# Patient Record
Sex: Female | Born: 1965 | Race: White | Hispanic: No | Smoking: Current every day smoker
Health system: Southern US, Community
[De-identification: ages and names within clinical notes are randomized; demographics above are authoritative.]

## PROBLEM LIST (undated history)

## (undated) HISTORY — PX: TYMPANOSTOMY TUBE PLACEMENT: SHX32

## (undated) HISTORY — PX: TONSILLECTOMY: SHX5217

---

## 2001-12-25 ENCOUNTER — Other Ambulatory Visit: Admission: RE | Admit: 2001-12-25 | Discharge: 2001-12-25 | Payer: Self-pay | Admitting: *Deleted

## 2002-02-01 ENCOUNTER — Inpatient Hospital Stay (HOSPITAL_COMMUNITY): Admission: AD | Admit: 2002-02-01 | Discharge: 2002-02-01 | Payer: Self-pay | Admitting: Gynecology

## 2002-02-02 ENCOUNTER — Ambulatory Visit (HOSPITAL_COMMUNITY): Admission: RE | Admit: 2002-02-02 | Discharge: 2002-02-02 | Payer: Self-pay | Admitting: *Deleted

## 2002-02-02 ENCOUNTER — Encounter (INDEPENDENT_AMBULATORY_CARE_PROVIDER_SITE_OTHER): Payer: Self-pay | Admitting: *Deleted

## 2004-12-31 ENCOUNTER — Other Ambulatory Visit: Admission: RE | Admit: 2004-12-31 | Discharge: 2004-12-31 | Payer: Self-pay | Admitting: Gynecology

## 2005-01-01 ENCOUNTER — Ambulatory Visit (HOSPITAL_COMMUNITY): Admission: RE | Admit: 2005-01-01 | Discharge: 2005-01-01 | Payer: Self-pay | Admitting: Gynecology

## 2006-01-03 ENCOUNTER — Ambulatory Visit (HOSPITAL_COMMUNITY): Admission: RE | Admit: 2006-01-03 | Discharge: 2006-01-03 | Payer: Self-pay | Admitting: Gynecology

## 2006-01-03 ENCOUNTER — Other Ambulatory Visit: Admission: RE | Admit: 2006-01-03 | Discharge: 2006-01-03 | Payer: Self-pay | Admitting: Gynecology

## 2006-01-18 ENCOUNTER — Encounter: Admission: RE | Admit: 2006-01-18 | Discharge: 2006-04-18 | Payer: Self-pay | Admitting: *Deleted

## 2006-04-01 ENCOUNTER — Encounter: Admission: RE | Admit: 2006-04-01 | Discharge: 2006-04-01 | Payer: Self-pay | Admitting: Orthopedic Surgery

## 2006-05-24 ENCOUNTER — Encounter: Admission: RE | Admit: 2006-05-24 | Discharge: 2006-05-24 | Payer: Self-pay | Admitting: Orthopedic Surgery

## 2006-06-08 ENCOUNTER — Encounter: Admission: RE | Admit: 2006-06-08 | Discharge: 2006-06-08 | Payer: Self-pay | Admitting: Orthopedic Surgery

## 2007-01-06 ENCOUNTER — Ambulatory Visit (HOSPITAL_COMMUNITY): Admission: RE | Admit: 2007-01-06 | Discharge: 2007-01-06 | Payer: Self-pay | Admitting: Gynecology

## 2007-01-16 ENCOUNTER — Other Ambulatory Visit: Admission: RE | Admit: 2007-01-16 | Discharge: 2007-01-16 | Payer: Self-pay | Admitting: Gynecology

## 2008-01-09 ENCOUNTER — Ambulatory Visit (HOSPITAL_COMMUNITY): Admission: RE | Admit: 2008-01-09 | Discharge: 2008-01-09 | Payer: Self-pay | Admitting: Gynecology

## 2008-01-19 ENCOUNTER — Encounter: Payer: Self-pay | Admitting: Women's Health

## 2008-01-19 ENCOUNTER — Other Ambulatory Visit: Admission: RE | Admit: 2008-01-19 | Discharge: 2008-01-19 | Payer: Self-pay | Admitting: Gynecology

## 2008-01-19 ENCOUNTER — Ambulatory Visit: Payer: Self-pay | Admitting: Women's Health

## 2008-02-14 ENCOUNTER — Ambulatory Visit: Payer: Self-pay | Admitting: Women's Health

## 2009-04-29 ENCOUNTER — Ambulatory Visit (HOSPITAL_COMMUNITY): Admission: RE | Admit: 2009-04-29 | Discharge: 2009-04-29 | Payer: Self-pay | Admitting: Gynecology

## 2009-05-19 ENCOUNTER — Other Ambulatory Visit: Admission: RE | Admit: 2009-05-19 | Discharge: 2009-05-19 | Payer: Self-pay | Admitting: Gynecology

## 2009-05-19 ENCOUNTER — Ambulatory Visit: Payer: Self-pay | Admitting: Women's Health

## 2010-04-14 ENCOUNTER — Other Ambulatory Visit: Payer: Self-pay | Admitting: Gynecology

## 2010-04-14 DIAGNOSIS — Z1231 Encounter for screening mammogram for malignant neoplasm of breast: Secondary | ICD-10-CM

## 2010-05-08 ENCOUNTER — Ambulatory Visit (HOSPITAL_COMMUNITY)
Admission: RE | Admit: 2010-05-08 | Discharge: 2010-05-08 | Disposition: A | Discharge status: Home / Self Care | Payer: BC Managed Care – PPO | Source: Ambulatory Visit | Attending: Gynecology | Admitting: Gynecology

## 2010-05-08 DIAGNOSIS — Z1231 Encounter for screening mammogram for malignant neoplasm of breast: Secondary | ICD-10-CM | POA: Insufficient documentation

## 2010-05-21 ENCOUNTER — Encounter: Payer: BC Managed Care – PPO | Admitting: Women's Health

## 2010-05-28 ENCOUNTER — Encounter (INDEPENDENT_AMBULATORY_CARE_PROVIDER_SITE_OTHER): Payer: BC Managed Care – PPO | Admitting: Women's Health

## 2010-05-28 ENCOUNTER — Other Ambulatory Visit (HOSPITAL_COMMUNITY)
Admission: RE | Admit: 2010-05-28 | Discharge: 2010-05-28 | Disposition: A | Discharge status: Home / Self Care | Payer: BC Managed Care – PPO | Source: Ambulatory Visit | Attending: Gynecology | Admitting: Gynecology

## 2010-05-28 ENCOUNTER — Other Ambulatory Visit: Payer: Self-pay | Admitting: Women's Health

## 2010-05-28 DIAGNOSIS — Z01419 Encounter for gynecological examination (general) (routine) without abnormal findings: Secondary | ICD-10-CM

## 2010-05-28 DIAGNOSIS — Z833 Family history of diabetes mellitus: Secondary | ICD-10-CM

## 2010-05-28 DIAGNOSIS — Z124 Encounter for screening for malignant neoplasm of cervix: Secondary | ICD-10-CM | POA: Insufficient documentation

## 2010-05-28 DIAGNOSIS — Z13220 Encounter for screening for lipoid disorders: Secondary | ICD-10-CM

## 2010-06-19 NOTE — Op Note (Signed)
NAME:  Angela Cooper, Angela Cooper                         ACCOUNT NO.:  [id number]   MEDICAL RECORD NO.:  [id number]                   PATIENT TYPE:  AMB   LOCATION:  [facility]                                  FACILITY:  [facility]   PHYSICIAN:  Katy Fitch, M.D.               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   HISTORY OF PRESENT ILLNESS:  The patient is a 45 year old G4, P1 at 43 weeks  who presented to the office two weeks prior for an amniocentesis secondary  to advanced maternal age.  The patient's amniocentesis was unremarkable;  however, the results came back on the 30th showing a 72 XY with a +21  chromosome.  The patient was called and notified of the results and at that  point did not want to proceed with the pregnancy.  The patient was given  options for surgical dilation and evacuation versus prostaglandin methods  and the risks for both procedures were discussed in detail with the patient.  After careful discussion with her husband, the patient has opted to proceed  with D&E which has been scheduled for the 2nd of January.   PAST MEDICAL HISTORY:  Significant for depression and asthma.   PAST SURGICAL HISTORY:  Two elective D&Cs for induced abortion.   MEDICATIONS:  Prenatal vitamins.   ALLERGIES:  No known drug allergies.   SOCIAL HISTORY:  Without any tobacco, alcohol, or drugs.   FAMILY HISTORY:  Without any mental retardation or epithelial cancers.   PHYSICAL EXAMINATION:  VITAL SIGNS:  Blood pressure 112/70.  HEENT:  Very clear.  LUNGS:  Clear to auscultation bilaterally.  HEART:  Regular rate and rhythm.  ABDOMEN:  Gravida, nontender.  EXTREMITIES:  Without edema, clubbing, cyanosis, or tenderness.   ASSESSMENT/PLAN:  A 45 year old G4, P1 at 17 weeks with Trisomy 21, elective  termination of the pregnancy.  The patient will be  brought in on the 1st of  January for laminaria placement.  Risks of the procedure were discussed with  the patient in detail over the phone.  The patient was explained that she  could potentially have bleeding, blood transfusion, risk for hepatitis or  HIV, loss of uterus if she bled too much, perforation of the uterus which  could require a laparotomy or laparoscopy to ascertain problems, damage to  internal organs, anesthetic complications, blood clots, stroke, pulmonary  embolism, heart attack, or death.  The patient states she understands these  risks and does desire to proceed.  The patient will be typed and crossed for  two units and n.p.o. after midnight.  Katy Fitch, M.D.   DC/MEDQ  D:  01/31/2002  T:  01/31/2002  Job:  [id number]

## 2010-06-19 NOTE — Op Note (Signed)
NAME:  Angela Cooper, KOCAK                         ACCOUNT NO.:  [id number]   MEDICAL RECORD NO.:  [id number]                   PATIENT TYPE:  AMB   LOCATION:  [facility]                                  [facility]   PHYSICIAN:  Katy Fitch, M.D.               DATE OF BIRTH:  [birth year]   DATE OF PROCEDURE:  02/02/2002  DATE OF DISCHARGE:                                 OPERATIVE REPORT   PREOPERATIVE DIAGNOSES:  A 17-week trisomy 21.   POSTOPERATIVE DIAGNOSES:  A 17-week trisomy 21.   PROCEDURE:  Dilatation and evacuation.   SURGEON:  Katy Fitch, M.D.   ASSIST:  Nadyne Coombes. Fontaine, M.D.   ANESTHESIA:  General.   ESTIMATED BLOOD LOSS:  200.   URINE OUTPUT:  50.   FLUIDS:  1 L crystalloid.   COMPLICATIONS:  None.   SPECIMENS:  Placenta and fetal parts.   FINDINGS:  A 17-week sized uterus, reconstruction of tissue.  Appeared to  have entire placenta and entire fetal parts after procedure.   PROCEDURE:  The patient was taken to the operating room where general  anesthesia was administered.  The patient was prepped and draped in a normal  sterile fashion.  A red rubber catheter was introduced in the bladder and  only 50 cc of urine was returned.  A Graves' speculum was placed into the  vagina.  Anterior lip of the cervix grasped with a single tooth tenaculum.  A paracervical block was used with 2% lidocaine.  The cervix was serially  dilated to approximately 46 [place].  At this point a number 16 suction  catheter was placed into the uterus and amniotic fluid was noted to get  returned.  A second pass was noted to remove the remainder of the fluid.  At  this point large grasping forceps were used to reach into the fundus to  start extracting the fetus.  It took approximately seven or eight passes to  remove fetal tissue.  At this point suction catheter was replaced in and at  this point the placenta was noted to come out.  This was done with a second  pass to get the  remainder of the placental tissue.  At this point  reconstruction was then performed on the table.  The head was missing and  one last pass of a grasping forceps was  placed into the fundus which was able to obtain this structure.  Two more  passes of the suction catheter were then performed to get the remainder of  tissue out.  The uterus had minimal bleeding postoperatively.  The speculum  and single tooth tenaculum were removed.  The patient was taken to the  recovery room in stable condition.  Katy Fitch, M.D.    DC/MEDQ  D:  02/02/2002  T:  02/02/2002  Job:  [id number]

## 2010-06-19 NOTE — H&P (Signed)
NAME:  Angela Cooper, Angela Cooper                         ACCOUNT NO.:  [id number]   MEDICAL RECORD NO.:  [id number]                   PATIENT TYPE:  MAT   LOCATION:  [facility]                                 [facility]:  [facility]   PHYSICIAN:  Timothy P. Fontaine, M.D.           DATE OF BIRTH:  1965-04-11   DATE OF ADMISSION:  02/01/2002  DATE OF DISCHARGE:                                HISTORY & PHYSICAL   EMERGENCY ROOM TRIAGE VISIT   CHIEF COMPLAINT:  ER evaluation.  The patient is a 44 year old G2 P45 female  at [redacted] weeks gestation who presents for laminaria placement prior to  scheduled D&E February 02, 2002.  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  The patient was counseled by Dr. Penni Homans who normally  takes care of her, and is scheduled to perform a D&E for the options, and I  again re-counseled her today as to the situation.  I reviewed with her that  Jeral Pinch syndrome is a spectrum that ranges from a functional individual to a  profoundly affected individual.  I discussed with her the options of  discussion with a genetic counselor and I offered her the option of genetic  counseling at [facility], and she declined.  She states that she and her  husband have previously discussed this in detail and they want to proceed  with termination.  The options with termination were reviewed with them to  include inductive forms of termination such as prostaglandin or Cytotec  versus D&E and the risks and benefits of both courses of action were  reviewed with her, and she wants to proceed with the D&E.  What is involved  with the D&E was discussed with her to include the risks of bleeding;  transfusion; infection; uterine perforation; damage to internal organs  including bowel, bladder, ureters, vessels, and nerves necessitating major  exploratory emergency surgeries and reparative surgeries including ostomy  formation.  Long-term effects such as infertility as a result of the  procedure was also discussed with her.  I also reviewed with her her future  risks as far as recurrent trisomy with subsequent pregnancies and discussed  with her follow-up counseling to help her deal with this situation.  She  actually is already involved with [facility] and will  continue to follow up with them for counseling, as well as she is going to  pursue group counseling sessions with parents who have undergone a similar  situation.  The patient's questions were answered.  She again was offered  the option of genetic counseling at [facility] and she declines.  The  options of continuation of the pregnancy versus termination were discussed,  and she and her husband both have decided to proceed with termination.   PROCEDURE:  Subsequently, her exam reveals a uterus consistent with dates.  Speculum  exam reveals cervix to be long and closed.  Betadine cleanse to the  cervix was applied.  A large laminaria was placed with a ring forceps  without difficulty.  Subsequently a second laminaria was placed without  difficulty.  Post laminaria instructions were given to the patient and she  will follow up tomorrow morning at her scheduled appointment for her D&E.   Of note, I did obtain a hard copy of her cytogenetic report and I reviewed  this with her to verify her name and that this was her specimen and that  this did indeed show an abnormal karyotype, and she reviewed this with me.                                               Timothy P. Audie Box, M.D.    TPF/MEDQ  D:  02/01/2002  T:  02/01/2002  Job:  [id number]

## 2010-11-20 ENCOUNTER — Other Ambulatory Visit: Payer: Self-pay | Admitting: Women's Health

## 2010-11-20 ENCOUNTER — Telehealth: Payer: Self-pay | Admitting: *Deleted

## 2010-11-20 DIAGNOSIS — R110 Nausea: Secondary | ICD-10-CM

## 2010-11-20 MED ORDER — PROMETHAZINE HCL 12.5 MG PO TABS: 12.5000 mg | ORAL_TABLET | Freq: Four times a day (QID) | ORAL | Status: DC | PRN

## 2010-11-20 MED ORDER — ALPRAZOLAM 0.25 MG PO TABS: 0.2500 mg | ORAL_TABLET | Freq: Three times a day (TID) | ORAL | Status: AC | PRN

## 2010-11-20 NOTE — Telephone Encounter (Signed)
Patient called crying, c/o husband asked for divorce a couple weeks ago.  She can't eat, can't sleep, needs meds she thinks for her nerves.  Wants to speak.

## 2010-11-20 NOTE — Telephone Encounter (Signed)
Telephone call, states has seen a marriage counselor, husband asking for divorce,. Tearful, with nausea and vomiting. States she has been unable to sleep or eat. States took a friends xanax 0.5 and was able to sleep. Requested something for the nausea and nerves. Strongly encouraged a counselor for self, states counselor she saw only does marriage not individual counseling. Berniece Andreas name and number was given will schedule an appointment. Will call in Phenergan 25 to take for the nausea and vomiting, did review may make drowsy but may help with sleep at night. Denies any feelings of harming herself. Daughter Ave Filter is doing ok and encouraged counseling for her as well.

## 2011-05-14 ENCOUNTER — Other Ambulatory Visit: Payer: Self-pay | Admitting: Women's Health

## 2011-05-14 DIAGNOSIS — Z1231 Encounter for screening mammogram for malignant neoplasm of breast: Secondary | ICD-10-CM

## 2011-06-03 ENCOUNTER — Encounter: Payer: BC Managed Care – PPO | Admitting: Women's Health

## 2011-06-10 ENCOUNTER — Ambulatory Visit (HOSPITAL_COMMUNITY)
Admission: RE | Admit: 2011-06-10 | Discharge: 2011-06-10 | Disposition: A | Discharge status: Home / Self Care | Payer: BC Managed Care – PPO | Source: Ambulatory Visit | Attending: Women's Health | Admitting: Women's Health

## 2011-06-10 DIAGNOSIS — Z1231 Encounter for screening mammogram for malignant neoplasm of breast: Secondary | ICD-10-CM | POA: Insufficient documentation

## 2011-06-24 ENCOUNTER — Encounter: Payer: Self-pay | Admitting: Women's Health

## 2011-06-24 ENCOUNTER — Ambulatory Visit (INDEPENDENT_AMBULATORY_CARE_PROVIDER_SITE_OTHER): Payer: BC Managed Care – PPO | Admitting: Women's Health

## 2011-06-24 VITALS — BP 120/78 | Ht 66.5 in | Wt 207.0 lb

## 2011-06-24 DIAGNOSIS — Z01419 Encounter for gynecological examination (general) (routine) without abnormal findings: Secondary | ICD-10-CM

## 2011-06-24 DIAGNOSIS — F411 Generalized anxiety disorder: Secondary | ICD-10-CM

## 2011-06-24 DIAGNOSIS — F419 Anxiety disorder, unspecified: Secondary | ICD-10-CM

## 2011-06-24 DIAGNOSIS — R140 Abdominal distension (gaseous): Secondary | ICD-10-CM

## 2011-06-24 DIAGNOSIS — Z113 Encounter for screening for infections with a predominantly sexual mode of transmission: Secondary | ICD-10-CM

## 2011-06-24 DIAGNOSIS — Z833 Family history of diabetes mellitus: Secondary | ICD-10-CM

## 2011-06-24 DIAGNOSIS — R141 Gas pain: Secondary | ICD-10-CM

## 2011-06-24 MED ORDER — ALPRAZOLAM 0.25 MG PO TABS: 0.2500 mg | ORAL_TABLET | Freq: Every evening | ORAL | Status: DC | PRN

## 2011-06-24 NOTE — Patient Instructions (Signed)

## 2011-06-24 NOTE — Progress Notes (Signed)
Angela Cooper 1965-05-10 [id number]    History:    The patient presents for annual exam.  Monthly 4-5 day cycle. History of normal Paps and mammograms. Struggling with situational stress of impending divorce, husband unfaithful, end of a 9 year marriage, tearful most of office visit. Quit smoking 04/2011. Has had problems with nausea, has lost 30 pounds from stress.    Past medical history, past surgical history, family history and social history were all reviewed and documented in the EPIC chart. Daughter Ave Filter age 3 has had gardasil. Is seeing a counselor also. Mother and maternal grandmother both died of ovarian cancer at age 59   ROS:  A  ROS was performed and pertinent positives and negatives are included in the history.  Exam:  Filed Vitals:   06/24/11 0923  BP: 120/78    General appearance:  Normal Head/Neck:  Normal, without cervical or supraclavicular adenopathy. Thyroid:  Symmetrical, normal in size, without palpable masses or nodularity. Respiratory  Effort:  Normal  Auscultation:  Clear without wheezing or rhonchi Cardiovascular  Auscultation:  Regular rate, without rubs, murmurs or gallops  Edema/varicosities:  Not grossly evident Abdominal  Soft,nontender, without masses, guarding or rebound.  Liver/spleen:  No organomegaly noted  Hernia:  None appreciated  Skin  Inspection:  Grossly normal  Palpation:  Grossly normal Neurologic/psychiatric  Orientation:  Normal with appropriate conversation.  Mood/affect:  Normal  Genitourinary    Breasts: Examined lying and sitting.     Right: Without masses, retractions, discharge or axillary adenopathy.     Left: Without masses, retractions, discharge or axillary adenopathy.   Inguinal/mons:  Normal without inguinal adenopathy  External genitalia:  Normal  BUS/Urethra/Skene's glands:  Normal  Bladder:  Normal  Vagina:  Normal  Cervix:  Normal  Uterus:   normal in size, shape and contour.  Midline and  mobile  Adnexa/parametria:     Rt: Without masses or tenderness.   Lt: Without masses or tenderness.  Anus and perineum: Normal  Digital rectal exam: Normal sphincter tone without palpated masses or tenderness  Assessment/Plan:  46 y.o. MWF G3P1 for annual exam.     Normal GYN exam Situational stress/impending divorce STD screen Mother and maternal grandmother- ovarian cancer  Plan: Reviewed importance of continuing counseling, Xanax 0.25 at bedtime when necessary, prescription given, reviewed addictive properties and to use sparingly. SBE's, continue annual mammogram, calcium rich diet, encouraged regular daily exercise, vitamin D 1000 daily. Reviewed importance of condoms if becomes sexually active. CBC, glucose, UA, GC/Chlamydia, HIV, hep B and C., RPR. Pelvic ultrasound after next cycle, bloating and abdominal pain. No Pap, ACOG's Pap screening recommendations reviewed, history of all normal Paps.    [name] WHNP, 1:07 PM 06/24/2011

## 2011-06-25 LAB — RPR

## 2011-06-25 LAB — CBC WITH DIFFERENTIAL/PLATELET
Basophils Absolute: 0 10*3/uL (ref 0.0–0.1)
Basophils Relative: 0 % (ref 0–1)
HCT: 36.6 % (ref 36.0–46.0)
Lymphocytes Relative: 32 % (ref 12–46)
Monocytes Absolute: 0.5 10*3/uL (ref 0.1–1.0)
Neutro Abs: 3.2 10*3/uL (ref 1.7–7.7)
Neutrophils Relative %: 56 % (ref 43–77)
RDW: 14.7 % (ref 11.5–15.5)
WBC: 5.5 10*3/uL (ref 4.0–10.5)

## 2011-06-25 LAB — URINALYSIS W MICROSCOPIC + REFLEX CULTURE
Glucose, UA: NEGATIVE mg/dL
Hgb urine dipstick: NEGATIVE
Leukocytes, UA: NEGATIVE
Nitrite: NEGATIVE
Protein, ur: NEGATIVE mg/dL
Urobilinogen, UA: 1 mg/dL (ref 0.0–1.0)

## 2011-06-25 LAB — HEPATITIS B SURFACE ANTIGEN: Hepatitis B Surface Ag: NEGATIVE

## 2011-06-25 LAB — GLUCOSE, RANDOM: Glucose, Bld: 79 mg/dL (ref 70–99)

## 2011-06-25 LAB — HIV ANTIBODY (ROUTINE TESTING W REFLEX): HIV: NONREACTIVE

## 2011-06-26 LAB — GC/CHLAMYDIA PROBE AMP, GENITAL
Chlamydia, DNA Probe: NEGATIVE
GC Probe Amp, Genital: NEGATIVE

## 2011-07-08 ENCOUNTER — Encounter: Payer: BC Managed Care – PPO | Admitting: Women's Health

## 2011-07-29 ENCOUNTER — Other Ambulatory Visit: Payer: BC Managed Care – PPO

## 2011-07-29 ENCOUNTER — Ambulatory Visit: Payer: BC Managed Care – PPO | Admitting: Women's Health

## 2012-06-29 ENCOUNTER — Ambulatory Visit (INDEPENDENT_AMBULATORY_CARE_PROVIDER_SITE_OTHER): Payer: BC Managed Care – PPO | Admitting: Women's Health

## 2012-06-29 ENCOUNTER — Encounter: Payer: Self-pay | Admitting: Women's Health

## 2012-06-29 ENCOUNTER — Other Ambulatory Visit (HOSPITAL_COMMUNITY)
Admission: RE | Admit: 2012-06-29 | Discharge: 2012-06-29 | Disposition: A | Discharge status: Home / Self Care | Payer: BC Managed Care – PPO | Source: Ambulatory Visit | Attending: Gynecology | Admitting: Gynecology

## 2012-06-29 VITALS — BP 114/70 | Ht 66.25 in | Wt 183.0 lb

## 2012-06-29 DIAGNOSIS — Z13220 Encounter for screening for lipoid disorders: Secondary | ICD-10-CM

## 2012-06-29 DIAGNOSIS — Z01419 Encounter for gynecological examination (general) (routine) without abnormal findings: Secondary | ICD-10-CM

## 2012-06-29 DIAGNOSIS — F411 Generalized anxiety disorder: Secondary | ICD-10-CM

## 2012-06-29 DIAGNOSIS — K3189 Other diseases of stomach and duodenum: Secondary | ICD-10-CM

## 2012-06-29 DIAGNOSIS — F419 Anxiety disorder, unspecified: Secondary | ICD-10-CM

## 2012-06-29 DIAGNOSIS — F17200 Nicotine dependence, unspecified, uncomplicated: Secondary | ICD-10-CM | POA: Insufficient documentation

## 2012-06-29 DIAGNOSIS — K30 Functional dyspepsia: Secondary | ICD-10-CM

## 2012-06-29 DIAGNOSIS — Z833 Family history of diabetes mellitus: Secondary | ICD-10-CM

## 2012-06-29 DIAGNOSIS — Z113 Encounter for screening for infections with a predominantly sexual mode of transmission: Secondary | ICD-10-CM

## 2012-06-29 LAB — CBC WITH DIFFERENTIAL/PLATELET
Basophils Absolute: 0 10*3/uL (ref 0.0–0.1)
Basophils Relative: 0 % (ref 0–1)
Eosinophils Absolute: 0.1 10*3/uL (ref 0.0–0.7)
Hemoglobin: 12.9 g/dL (ref 12.0–15.0)
MCH: 31.2 pg (ref 26.0–34.0)
MCHC: 34.1 g/dL (ref 30.0–36.0)
Monocytes Absolute: 0.6 10*3/uL (ref 0.1–1.0)
Monocytes Relative: 12 % (ref 3–12)
Neutro Abs: 3.1 10*3/uL (ref 1.7–7.7)
Neutrophils Relative %: 62 % (ref 43–77)
RDW: 14.7 % (ref 11.5–15.5)

## 2012-06-29 LAB — GLUCOSE, RANDOM: Glucose, Bld: 91 mg/dL (ref 70–99)

## 2012-06-29 LAB — LIPID PANEL
Cholesterol: 154 mg/dL (ref 0–200)
Total CHOL/HDL Ratio: 2.6 Ratio
VLDL: 11 mg/dL (ref 0–40)

## 2012-06-29 LAB — RPR

## 2012-06-29 MED ORDER — ESOMEPRAZOLE MAGNESIUM 40 MG PO CPDR: 40.0000 mg | DELAYED_RELEASE_CAPSULE | Freq: Every day | ORAL | Status: DC

## 2012-06-29 MED ORDER — ALPRAZOLAM 0.25 MG PO TABS: 0.2500 mg | ORAL_TABLET | Freq: Every evening | ORAL | Status: DC | PRN

## 2012-06-29 NOTE — Progress Notes (Signed)
Angela Cooper 07-Oct-1965 [id number]    History:    The patient presents for annual exam.  Monthly cycle, sexually active new partner/condoms. History of normal Paps and mammograms. Has lost another 25 pounds due to stress of impending divorce ( total >50 pounds). History of normal Paps and mammograms. smoker   Past medical history, past surgical history, family history and social history were all reviewed and documented in the EPIC chart. Working [employer]. Ave Filter 16 has had gardasil doing well. Mother and maternal grandmother both had ovarian cancer, mother died at age 65. Father diabetes and hypertension.   ROS:  A  ROS was performed and pertinent positives and negatives are included in the history.  Exam:  Filed Vitals:   06/29/12 0808  BP: 114/70    General appearance:  Normal Head/Neck:  Normal, without cervical or supraclavicular adenopathy. Thyroid:  Symmetrical, normal in size, without palpable masses or nodularity. Respiratory  Effort:  Normal  Auscultation:  Clear without wheezing or rhonchi Cardiovascular  Auscultation:  Regular rate, without rubs, murmurs or gallops  Edema/varicosities:  Not grossly evident Abdominal  Soft,nontender, without masses, guarding or rebound.  Liver/spleen:  No organomegaly noted  Hernia:  None appreciated  Skin  Inspection:  Grossly normal  Palpation:  Grossly normal Neurologic/psychiatric  Orientation:  Normal with appropriate conversation.  Mood/affect:  Normal  Genitourinary    Breasts: Examined lying and sitting.     Right: Without masses, retractions, discharge or axillary adenopathy.     Left: Without masses, retractions, discharge or axillary adenopathy.   Inguinal/mons:  Normal without inguinal adenopathy  External genitalia:  Normal  BUS/Urethra/Skene's glands:  Normal  Bladder:  Normal  Vagina:  Normal  Cervix:  Normal  Uterus:   normal in size, shape and contour.  Midline and  mobile  Adnexa/parametria:     Rt: Without masses or tenderness.   Lt: Without masses or tenderness.  Anus and perineum: Normal  Digital rectal exam: Normal sphincter tone without palpated masses or tenderness  Assessment/Plan:  47 y.o. separated WF G1 P1 for annual exam with no complaints.   Situational stress/impending divorce Mother/maternal grandmother ovarian cancer after menopause Contraception management STD screen smoker  Plan: Contraception options reviewed, smoker, reviewed no combination OCPs, Mirena IUD information given reviewed slight risk for infection, perforation or hemorrhage, reviewed Dr. Lily Peer would place with a cycle. Condoms encouraged until. Is aware of importance of decreasing/ quitting smoking for health. SBE's, continue annual mammogram, calcium rich diet, vitamin D 1000 daily and regular exercise encouraged. Continue counseling as needed for situational stress of impending divorce, Xanax 0.25 uses rarely prescription given is aware of addictive properties. Discussed prophylactic hysterectomy with oophorectomy at  Menopause. CBC, glucose, lipid panel, UA, Pap, Pap normal 2012, new screening guidelines reviewed. GC/Chlamydia, HIV, hep B., C., RPRHarrington Challenger WHNP, 9:18 AM 06/29/2012

## 2012-06-29 NOTE — Patient Instructions (Addendum)

## 2012-06-29 NOTE — Addendum Note (Signed)
Addended by: Richardson Chiquito on: 06/29/2012 03:12 PM   Modules accepted: Orders

## 2012-06-30 ENCOUNTER — Other Ambulatory Visit: Payer: Self-pay | Admitting: Gynecology

## 2012-06-30 ENCOUNTER — Telehealth: Payer: Self-pay | Admitting: Gynecology

## 2012-06-30 DIAGNOSIS — Z3049 Encounter for surveillance of other contraceptives: Secondary | ICD-10-CM

## 2012-06-30 LAB — URINALYSIS W MICROSCOPIC + REFLEX CULTURE
Bacteria, UA: NONE SEEN
Bilirubin Urine: NEGATIVE
Casts: NONE SEEN
Glucose, UA: NEGATIVE mg/dL
Hgb urine dipstick: NEGATIVE
Ketones, ur: NEGATIVE mg/dL
Nitrite: NEGATIVE
pH: 5.5 (ref 5.0–8.0)

## 2012-06-30 LAB — GC/CHLAMYDIA PROBE AMP: CT Probe RNA: NEGATIVE

## 2012-06-30 MED ORDER — LEVONORGESTREL 20 MCG/24HR IU IUD: INTRAUTERINE_SYSTEM | Freq: Once | INTRAUTERINE | Status: DC

## 2012-06-30 NOTE — Telephone Encounter (Signed)
06/30/12-Pt was informed today that her [facility] insurance covers the Mirena & insertion at 100%,no copay., She will call first day of next cycle to schedule insertion with [facility]

## 2012-07-04 ENCOUNTER — Encounter: Payer: Self-pay | Admitting: Women's Health

## 2012-07-14 ENCOUNTER — Encounter: Payer: Self-pay | Admitting: Gynecology

## 2012-07-14 ENCOUNTER — Ambulatory Visit (INDEPENDENT_AMBULATORY_CARE_PROVIDER_SITE_OTHER): Payer: BC Managed Care – PPO | Admitting: Gynecology

## 2012-07-14 VITALS — BP 120/82

## 2012-07-14 DIAGNOSIS — Z30430 Encounter for insertion of intrauterine contraceptive device: Secondary | ICD-10-CM

## 2012-07-14 NOTE — Patient Instructions (Addendum)
Intrauterine Device Information  An intrauterine device (IUD) is inserted into your uterus and prevents pregnancy. There are 2 types of IUDs available:  · Copper IUD. This type of IUD is wrapped in copper wire and is placed inside the uterus. Copper makes the uterus and fallopian tubes produce a fluid that kills sperm. The copper IUD can stay in place for 10 years.  · Hormone IUD. This type of IUD contains the hormone progestin (synthetic progesterone). The hormone thickens the cervical mucus and prevents sperm from entering the uterus, and it also thins the uterine lining to prevent implantation of a fertilized egg. The hormone can weaken or kill the sperm that get into the uterus. The hormone IUD can stay in place for 5 years.  Your caregiver will make sure you are a good candidate for a contraceptive IUD. Discuss with your caregiver the possible side effects.  ADVANTAGES  · It is highly effective, reversible, long-acting, and low maintenance.  · There are no estrogen-related side effects.  · An IUD can be used when breastfeeding.  · It is not associated with weight gain.  · It works immediately after insertion.  · The copper IUD does not interfere with your female hormones.  · The progesterone IUD can make heavy menstrual periods lighter.  · The progesterone IUD can be used for 5 years.  · The copper IUD can be used for 10 years.  DISADVANTAGES  · The progesterone IUD can be associated with irregular bleeding patterns.  · The copper IUD can make your menstrual flow heavier and more painful.  · You may experience cramping and vaginal bleeding after insertion.  Document Released: 12/23/2003 Document Revised: 04/12/2011 Document Reviewed: 05/23/2010  Exit[employer]® Patient Information ©2014 [employer].

## 2012-07-14 NOTE — Progress Notes (Signed)
     IUD procedure note       Patient presented to the office today for placement of Mirena IUD. The patient had previously been provided with literature information on this method of contraception. The risks benefits and pros and cons were discussed and all her questions were answered. She is fully aware that this form of contraception is 99% effective and is good for 5 years.  Pelvic exam: Bartholin urethra Skene glands: Within normal limits Vagina: No lesions or discharge Cervix: No lesions or discharge Uterus: anteverted position Adnexa: No masses or tenderness Rectal exam: Not done  The cervix was cleansed with Betadine solution. A single-tooth tenaculum was placed on the anterior cervical lip. The uterus sounded to 7 cm centimeter. The IUD was shown to the patient and inserted in a sterile fashion. The IUD string was trimmed. The single-tooth tenaculum was removed. Patient was instructed to return back to the office in one month for follow up.

## 2012-07-17 ENCOUNTER — Encounter: Payer: Self-pay | Admitting: Gynecology

## 2012-08-18 ENCOUNTER — Encounter: Payer: Self-pay | Admitting: Gynecology

## 2012-08-18 ENCOUNTER — Ambulatory Visit (INDEPENDENT_AMBULATORY_CARE_PROVIDER_SITE_OTHER): Payer: BC Managed Care – PPO | Admitting: Gynecology

## 2012-08-18 VITALS — BP 130/84

## 2012-08-18 DIAGNOSIS — N870 Mild cervical dysplasia: Secondary | ICD-10-CM

## 2012-08-18 NOTE — Progress Notes (Signed)
Patient presented to the office today for colposcopic examination due to the fact that at time of her annual exam her pap smear demonstrated the following:  LOW GRADE SQUAMOUS INTRAEPITHELIAL LESION: CIN-1/ HPV (LSIL).  Patient denies any prior abnormal pap smears. She was seen last month whereby she had a Mirena IUD placed. She had recently requested an STD Sctreen.GC/Chalamydia,HIV,RPR as well as GC and Chlamydia culture were negative and also Hep B &  C.  Colposcopic exam today:   Physical Exam  Genitourinary:     With the use of the endocervical speculum a small acetowhite area was noted at the transformation zone at 6:00 position which was biopsied. An ECC was then obtained as well Monsel solution and silver nitrate was used for additional hemostasis. IUD string was visualized. Patient will be notified with the results next week and plan course of management accordingly.

## 2012-08-18 NOTE — Patient Instructions (Addendum)
Colposcopy Colposcopy is a procedure that uses a special lighted microscope (colposcope). It examines your cervix and vagina, or the area around the outside of the vagina, for signs of disease or abnormalities in the cells. You may be sent to a specialist (gynecologist) to do the colposcopy. A biopsy (tissue sample) may be collected during a colposcopy, if the caregiver finds any unusual cells. The biopsy is sent to the lab for further testing, and the results are reported back to your caregiver. A WOMAN MAY NEED THIS PROCEDURE IF:  She has had an abnormal pap smear (taking cells from the cervix for testing).  She has a sore on her cervix, and a Pap test was normal.  The Pap test suggests human papilloma virus (HPV). This virus can cause genital warts and is linked to the development of cervical cancer.  She has genital warts on the cervix, or in or around the outside of the vagina.  Her mother took the drug DES while pregnant.  She has painful intercourse.  She has vaginal bleeding, especially after sexual intercourse.  There is a need to evaluate the results of previous treatment. BEFORE THE PROCEDURE   Colposcopy is done when you are not having a menstrual period.  For 24 hours before the colposcopy, do not:  Douche.  Use tampons.  Use medicines, creams, or suppositories in the vagina.  Have sexual intercourse. PROCEDURE   A colposcopy is done while a woman is lying on her back with her feet in foot rests (stirrups).  A speculum is placed inside the vagina to keep it open and to allow the caregiver to see the cervix. This is the same instrument used to do a pap smear.  The colposcope is placed outside the vagina. It is used to magnify and examine the cervix, vagina, and the area around the outside of the vagina.  A small amount of liquid solution is placed on the area that is to be viewed. This solution is placed on with a cotton applicator. This solution makes it easier to  see the abnormal cells.  Your caregiver will suck out mucus and cells from the canal of the cervix.  Small pieces of tissue for biopsy may be taken at the same time. You may feel mild pain or discomfort when this is done.  Your caregiver will record the location of the abnormal areas and send the tissue samples to a lab for analysis.  If your caregiver biopsies the vagina or outside of the vagina, a local anesthetic (novocaine) is usually given. AFTER THE PROCEDURE   You may have some cramping that often goes away in a few minutes. You may have some soreness for a couple of days.  You may take over-the-counter pain medicine as advised by your caregiver. Do not take aspirin because it can cause bleeding.  Lie down for a few minutes if you feel lightheaded.  You may have some bleeding or dark discharge that should stop in a few days.  You may need to wear a sanitary pad for a few days. HOME CARE INSTRUCTIONS   Avoid sex, douching, and using tampons for a week or as directed.  Only take medicine as directed by your caregiver.  Continue to take birth control pills, if you are on them.  Not all test results are available during your visit. If your test results are not back during the visit, make an appointment with your caregiver to find out the results. Do not assume everything is   normal if you have not heard from your caregiver or the medical facility. It is important for you to follow up on all of your test results.  Follow your caregiver's advice regarding medicines, activity, follow-up visits, and follow-up Pap tests. SEEK MEDICAL CARE IF:   You develop a rash.  You have problems with your medicine. SEEK IMMEDIATE MEDICAL CARE IF:  You are bleeding heavily or are passing blood clots.  You develop a fever over 102 F (38.9 C), with or without chills.  You have abnormal vaginal discharge.  You are having cramps that do not go away after taking your pain medicine.  You  feel lightheaded, dizzy, or faint.  You develop stomach pain. Document Released: 04/10/2002 Document Revised: 04/12/2011 Document Reviewed: 11/21/2008 [employer] Patient Information 2014 [employer].  

## 2013-07-03 ENCOUNTER — Other Ambulatory Visit (HOSPITAL_COMMUNITY)
Admission: RE | Admit: 2013-07-03 | Discharge: 2013-07-03 | Disposition: A | Discharge status: Home / Self Care | Payer: BC Managed Care – PPO | Source: Ambulatory Visit | Attending: Women's Health | Admitting: Women's Health

## 2013-07-03 ENCOUNTER — Encounter: Payer: Self-pay | Admitting: Women's Health

## 2013-07-03 ENCOUNTER — Ambulatory Visit (INDEPENDENT_AMBULATORY_CARE_PROVIDER_SITE_OTHER): Payer: BC Managed Care – PPO | Admitting: Women's Health

## 2013-07-03 VITALS — BP 104/78 | Ht 66.25 in | Wt 175.0 lb

## 2013-07-03 DIAGNOSIS — R87810 Cervical high risk human papillomavirus (HPV) DNA test positive: Secondary | ICD-10-CM | POA: Insufficient documentation

## 2013-07-03 DIAGNOSIS — Z13220 Encounter for screening for lipoid disorders: Secondary | ICD-10-CM

## 2013-07-03 DIAGNOSIS — Z833 Family history of diabetes mellitus: Secondary | ICD-10-CM

## 2013-07-03 DIAGNOSIS — Z01419 Encounter for gynecological examination (general) (routine) without abnormal findings: Secondary | ICD-10-CM

## 2013-07-03 DIAGNOSIS — F411 Generalized anxiety disorder: Secondary | ICD-10-CM

## 2013-07-03 DIAGNOSIS — Z124 Encounter for screening for malignant neoplasm of cervix: Secondary | ICD-10-CM | POA: Insufficient documentation

## 2013-07-03 DIAGNOSIS — F419 Anxiety disorder, unspecified: Secondary | ICD-10-CM

## 2013-07-03 DIAGNOSIS — N870 Mild cervical dysplasia: Secondary | ICD-10-CM

## 2013-07-03 DIAGNOSIS — Z1151 Encounter for screening for human papillomavirus (HPV): Secondary | ICD-10-CM | POA: Insufficient documentation

## 2013-07-03 LAB — CBC WITH DIFFERENTIAL/PLATELET
Basophils Absolute: 0 10*3/uL (ref 0.0–0.1)
Basophils Relative: 0 % (ref 0–1)
Eosinophils Absolute: 0.1 10*3/uL (ref 0.0–0.7)
Eosinophils Relative: 3 % (ref 0–5)
HCT: 35.1 % — ABNORMAL LOW (ref 36.0–46.0)
HEMOGLOBIN: 12 g/dL (ref 12.0–15.0)
LYMPHS ABS: 1.3 10*3/uL (ref 0.7–4.0)
LYMPHS PCT: 27 % (ref 12–46)
MCH: 30.9 pg (ref 26.0–34.0)
MCHC: 34.2 g/dL (ref 30.0–36.0)
MCV: 90.5 fL (ref 78.0–100.0)
MONOS PCT: 10 % (ref 3–12)
Monocytes Absolute: 0.5 10*3/uL (ref 0.1–1.0)
NEUTROS ABS: 2.9 10*3/uL (ref 1.7–7.7)
NEUTROS PCT: 60 % (ref 43–77)
PLATELETS: 232 10*3/uL (ref 150–400)
RBC: 3.88 MIL/uL (ref 3.87–5.11)
RDW: 14.8 % (ref 11.5–15.5)
WBC: 4.9 10*3/uL (ref 4.0–10.5)

## 2013-07-03 LAB — LIPID PANEL
CHOL/HDL RATIO: 2.5 ratio
CHOLESTEROL: 135 mg/dL (ref 0–200)
HDL: 55 mg/dL (ref >39)
LDL Cholesterol: 63 mg/dL (ref 0–99)
Triglycerides: 86 mg/dL (ref <150)
VLDL: 17 mg/dL (ref 0–40)

## 2013-07-03 LAB — GLUCOSE, RANDOM: Glucose, Bld: 71 mg/dL (ref 70–99)

## 2013-07-03 MED ORDER — ALPRAZOLAM 0.25 MG PO TABS: 0.2500 mg | ORAL_TABLET | Freq: Every evening | ORAL | Status: DC | PRN

## 2013-07-03 NOTE — Patient Instructions (Signed)
Health Maintenance, Female A healthy lifestyle and preventative care can promote health and wellness.  Maintain regular health, dental, and eye exams.  Eat a healthy diet. Foods like vegetables, fruits, whole grains, low-fat dairy products, and lean protein foods contain the nutrients you need without too many calories. Decrease your intake of foods high in solid fats, added sugars, and salt. Get information about a proper diet from your caregiver, if necessary.  Regular physical exercise is one of the most important things you can do for your health. Most adults should get at least 150 minutes of moderate-intensity exercise (any activity that increases your heart rate and causes you to sweat) each week. In addition, most adults need muscle-strengthening exercises on 2 or more days a week.   Maintain a healthy weight. The body mass index (BMI) is a screening tool to identify possible weight problems. It provides an estimate of body fat based on height and weight. Your caregiver can help determine your BMI, and can help you achieve or maintain a healthy weight. For adults 20 years and older:  A BMI below 18.5 is considered underweight.  A BMI of 18.5 to 24.9 is normal.  A BMI of 25 to 29.9 is considered overweight.  A BMI of 30 and above is considered obese.  Maintain normal blood lipids and cholesterol by exercising and minimizing your intake of saturated fat. Eat a balanced diet with plenty of fruits and vegetables. Blood tests for lipids and cholesterol should begin at age 102 and be repeated every 5 years. If your lipid or cholesterol levels are high, you are over 50, or you are a high risk for heart disease, you may need your cholesterol levels checked more frequently.Ongoing high lipid and cholesterol levels should be treated with medicines if diet and exercise are not effective.  If you smoke, find out from your caregiver how to quit. If you do not use tobacco, do not start.  Lung  cancer screening is recommended for adults aged 30 80 years who are at high risk for developing lung cancer because of a history of smoking. Yearly low-dose computed tomography (CT) is recommended for people who have at least a 30-pack-year history of smoking and are a current smoker or have quit within the past 15 years. A pack year of smoking is smoking an average of 1 pack of cigarettes a day for 1 year (for example: 1 pack a day for 30 years or 2 packs a day for 15 years). Yearly screening should continue until the smoker has stopped smoking for at least 15 years. Yearly screening should also be stopped for people who develop a health problem that would prevent them from having lung cancer treatment.  If you are pregnant, do not drink alcohol. If you are breastfeeding, be very cautious about drinking alcohol. If you are not pregnant and choose to drink alcohol, do not exceed 1 drink per day. One drink is considered to be 12 ounces (355 mL) of beer, 5 ounces (148 mL) of wine, or 1.5 ounces (44 mL) of liquor.  Avoid use of street drugs. Do not share needles with anyone. Ask for help if you need support or instructions about stopping the use of drugs.  High blood pressure causes heart disease and increases the risk of stroke. Blood pressure should be checked at least every 1 to 2 years. Ongoing high blood pressure should be treated with medicines, if weight loss and exercise are not effective.  If you are 55 to  47 years old, ask your caregiver if you should take aspirin to prevent strokes.  Diabetes screening involves taking a blood sample to check your fasting blood sugar level. This should be done once every 3 years, after age 65, if you are within normal weight and without risk factors for diabetes. Testing should be considered at a younger age or be carried out more frequently if you are overweight and have at least 1 risk factor for diabetes.  Breast cancer screening is essential preventative care  for women. You should practice "breast self-awareness." This means understanding the normal appearance and feel of your breasts and may include breast self-examination. Any changes detected, no matter how small, should be reported to a caregiver. Women in their 74s and 30s should have a clinical breast exam (CBE) by a caregiver as part of a regular health exam every 1 to 3 years. After age 47, women should have a CBE every year. Starting at age 2, women should consider having a mammogram (breast X-ray) every year. Women who have a family history of breast cancer should talk to their caregiver about genetic screening. Women at a high risk of breast cancer should talk to their caregiver about having an MRI and a mammogram every year.  Breast cancer gene (BRCA)-related cancer risk assessment is recommended for women who have family members with BRCA-related cancers. BRCA-related cancers include breast, ovarian, tubal, and peritoneal cancers. Having family members with these cancers may be associated with an increased risk for harmful changes (mutations) in the breast cancer genes BRCA1 and BRCA2. Results of the assessment will determine the need for genetic counseling and BRCA1 and BRCA2 testing.  The Pap test is a screening test for cervical cancer. Women should have a Pap test starting at age 68. Between ages 80 and 46, Pap tests should be repeated every 2 years. Beginning at age 30, you should have a Pap test every 3 years as long as the past 3 Pap tests have been normal. If you had a hysterectomy for a problem that was not cancer or a condition that could lead to cancer, then you no longer need Pap tests. If you are between ages 6 and 10, and you have had normal Pap tests going back 10 years, you no longer need Pap tests. If you have had past treatment for cervical cancer or a condition that could lead to cancer, you need Pap tests and screening for cancer for at least 20 years after your treatment. If Pap  tests have been discontinued, risk factors (such as a new sexual partner) need to be reassessed to determine if screening should be resumed. Some women have medical problems that increase the chance of getting cervical cancer. In these cases, your caregiver may recommend more frequent screening and Pap tests.  The human papillomavirus (HPV) test is an additional test that may be used for cervical cancer screening. The HPV test looks for the virus that can cause the cell changes on the cervix. The cells collected during the Pap test can be tested for HPV. The HPV test could be used to screen women aged 62 years and older, and should be used in women of any age who have unclear Pap test results. After the age of 100, women should have HPV testing at the same frequency as a Pap test.  Colorectal cancer can be detected and often prevented. Most routine colorectal cancer screening begins at the age of 71 and continues through age 53. However, your caregiver  may recommend screening at an earlier age if you have risk factors for colon cancer. On a yearly basis, your caregiver may provide home test kits to check for hidden blood in the stool. Use of a small camera at the end of a tube, to directly examine the colon (sigmoidoscopy or colonoscopy), can detect the earliest forms of colorectal cancer. Talk to your caregiver about this at age 73, when routine screening begins. Direct examination of the colon should be repeated every 5 to 10 years through age 61, unless early forms of pre-cancerous polyps or small growths are found.  Hepatitis C blood testing is recommended for all people born from [birth year] through 1964 and any individual with known risks for hepatitis C.  Practice safe sex. Use condoms and avoid high-risk sexual practices to reduce the spread of sexually transmitted infections (STIs). Sexually active women aged 38 and younger should be checked for Chlamydia, which is a common sexually transmitted infection.  Older women with new or multiple partners should also be tested for Chlamydia. Testing for other STIs is recommended if you are sexually active and at increased risk.  Osteoporosis is a disease in which the bones lose minerals and strength with aging. This can result in serious bone fractures. The risk of osteoporosis can be identified using a bone density scan. Women ages 44 and over and women at risk for fractures or osteoporosis should discuss screening with their caregivers. Ask your caregiver whether you should be taking a calcium supplement or vitamin D to reduce the rate of osteoporosis.  Menopause can be associated with physical symptoms and risks. Hormone replacement therapy is available to decrease symptoms and risks. You should talk to your caregiver about whether hormone replacement therapy is right for you.  Use sunscreen. Apply sunscreen liberally and repeatedly throughout the day. You should seek shade when your shadow is shorter than you. Protect yourself by wearing long sleeves, pants, a wide-brimmed hat, and sunglasses year round, whenever you are outdoors.  Notify your caregiver of new moles or changes in moles, especially if there is a change in shape or color. Also notify your caregiver if a mole is larger than the size of a pencil eraser.  Stay current with your immunizations. Document Released: 08/03/2010 Document Revised: 05/15/2012 Document Reviewed: 08/03/2010 [facility] Patient Information 2014 [place].

## 2013-07-03 NOTE — Progress Notes (Signed)
Angela Cooper [birth year] [id number]    History:    Presents for annual exam.  Irregular cycle Mirena IUD 07/2012. Same partner negative STD screen. Normal mammogram history. 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 Mother and maternal grandmother and ovarian cancer, mother died age 47. Smoker. Has lost over 60 pounds and has kept off.  Past medical history, past surgical history, family history and social history were all reviewed and documented in the EPIC chart. Works at the [employer]. [name] planning to start [employer] in a fall. Father diabetes and hypertension.  ROS:  A  12 point ROS was performed and pertinent positives and negatives are included.  Exam:  Filed Vitals:   07/03/13 0828  BP: 104/78    General appearance:  Normal Thyroid:  Symmetrical, normal in size, without palpable masses or nodularity. Respiratory  Auscultation:  Clear without wheezing or rhonchi Cardiovascular  Auscultation:  Regular rate, without rubs, murmurs or gallops  Edema/varicosities:  Not grossly evident Abdominal  Soft,nontender, without masses, guarding or rebound.  Liver/spleen:  No organomegaly noted  Hernia:  None appreciated  Skin  Inspection:  Grossly normal   Breasts: Examined lying and sitting.     Right: Without masses, retractions, discharge or axillary adenopathy.     Left: Without masses, retractions, discharge or axillary adenopathy. Gentitourinary   Inguinal/mons:  Normal without inguinal adenopathy  External genitalia:  Normal  BUS/Urethra/Skene's glands:  Normal  Vagina:  Normal  Cervix:  Normal IUD strings visible  Uterus:   normal in size, shape and contour.  Midline and mobile  Adnexa/parametria:     Rt: Without masses or tenderness.   Lt: Without masses or tenderness.  Anus and perineum: Normal  Digital rectal exam: Normal sphincter tone without palpated masses or tenderness  Assessment/Plan:  48 y.o. DWF G1P1 for annual exam no  complaints.  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 Regular exercise, calcium rich diet, vitamin D 1000 daily encouraged. BRCA Testing, prophylactic oophorectomy reviewed. CA 125 CBC, glucose, lipid panel, UA, Pap with HR HPV typing. Pap screening guidelines reviewed. Aware of hazards of smoking, is trying to cut down.      Note: This dictation was prepared with Dragon/digital dictation.  Any transcriptional errors that result are unintentional. Angela Cooper [facility], 9:45 AM 07/03/2013

## 2013-07-04 LAB — CA 125: CA 125: 9.1 U/mL (ref 0.0–30.2)

## 2013-07-04 LAB — URINALYSIS W MICROSCOPIC + REFLEX CULTURE
Bilirubin Urine: NEGATIVE
Casts: NONE SEEN
Crystals: NONE SEEN
GLUCOSE, UA: NEGATIVE mg/dL
HGB URINE DIPSTICK: NEGATIVE
Ketones, ur: NEGATIVE mg/dL
Leukocytes, UA: NEGATIVE
Nitrite: NEGATIVE
PROTEIN: NEGATIVE mg/dL
Specific Gravity, Urine: 1.012 (ref 1.005–1.030)
Urobilinogen, UA: 0.2 mg/dL (ref 0.0–1.0)
pH: 7 (ref 5.0–8.0)

## 2013-07-04 LAB — TSH: TSH: 1.332 u[IU]/mL (ref 0.350–4.500)

## 2013-07-04 LAB — CYTOLOGY - PAP

## 2013-07-13 ENCOUNTER — Other Ambulatory Visit: Payer: Self-pay | Admitting: Women's Health

## 2013-07-13 MED ORDER — ESOMEPRAZOLE MAGNESIUM 40 MG PO CPDR: 40.0000 mg | DELAYED_RELEASE_CAPSULE | Freq: Every day | ORAL | Status: DC

## 2013-07-18 ENCOUNTER — Telehealth: Payer: Self-pay

## 2013-07-18 NOTE — Telephone Encounter (Signed)
error 

## 2013-07-25 ENCOUNTER — Telehealth: Payer: Self-pay

## 2013-07-25 NOTE — Telephone Encounter (Signed)
Pt was seen yesterday for WC. She says she has taken 4 pain pills already and they are not working.

## 2013-07-25 NOTE — Telephone Encounter (Signed)
Sent this message to Elmyra RicksKim S to put in [place] since [employer] comp notes are not in epic.  She did this.

## 2013-12-03 ENCOUNTER — Encounter: Payer: Self-pay | Admitting: Women's Health

## 2013-12-10 ENCOUNTER — Other Ambulatory Visit: Payer: Self-pay | Admitting: Orthopedic Surgery

## 2013-12-10 DIAGNOSIS — M79671 Pain in right foot: Secondary | ICD-10-CM

## 2013-12-13 ENCOUNTER — Ambulatory Visit
Admission: RE | Admit: 2013-12-13 | Discharge: 2013-12-13 | Disposition: A | Discharge status: Home / Self Care | Payer: BC Managed Care – PPO | Source: Ambulatory Visit | Attending: Orthopedic Surgery | Admitting: Orthopedic Surgery

## 2013-12-13 ENCOUNTER — Other Ambulatory Visit: Payer: BC Managed Care – PPO

## 2013-12-13 DIAGNOSIS — M79671 Pain in right foot: Secondary | ICD-10-CM

## 2014-07-05 ENCOUNTER — Ambulatory Visit (INDEPENDENT_AMBULATORY_CARE_PROVIDER_SITE_OTHER): Payer: BLUE CROSS/BLUE SHIELD | Admitting: Women's Health

## 2014-07-05 ENCOUNTER — Encounter: Payer: Self-pay | Admitting: Women's Health

## 2014-07-05 VITALS — BP 109/72 | Ht 68.0 in | Wt 188.0 lb

## 2014-07-05 DIAGNOSIS — Z113 Encounter for screening for infections with a predominantly sexual mode of transmission: Secondary | ICD-10-CM | POA: Diagnosis not present

## 2014-07-05 DIAGNOSIS — F419 Anxiety disorder, unspecified: Secondary | ICD-10-CM | POA: Diagnosis not present

## 2014-07-05 DIAGNOSIS — Z13220 Encounter for screening for lipoid disorders: Secondary | ICD-10-CM | POA: Diagnosis not present

## 2014-07-05 DIAGNOSIS — Z01419 Encounter for gynecological examination (general) (routine) without abnormal findings: Secondary | ICD-10-CM | POA: Diagnosis not present

## 2014-07-05 DIAGNOSIS — N760 Acute vaginitis: Secondary | ICD-10-CM | POA: Diagnosis not present

## 2014-07-05 DIAGNOSIS — A499 Bacterial infection, unspecified: Secondary | ICD-10-CM

## 2014-07-05 DIAGNOSIS — B9689 Other specified bacterial agents as the cause of diseases classified elsewhere: Secondary | ICD-10-CM

## 2014-07-05 LAB — CBC WITH DIFFERENTIAL/PLATELET
Basophils Absolute: 0 10*3/uL (ref 0.0–0.1)
Basophils Relative: 0 % (ref 0–1)
EOS ABS: 0.2 10*3/uL (ref 0.0–0.7)
EOS PCT: 4 % (ref 0–5)
HEMATOCRIT: 37.9 % (ref 36.0–46.0)
HEMOGLOBIN: 12.7 g/dL (ref 12.0–15.0)
LYMPHS ABS: 1.9 10*3/uL (ref 0.7–4.0)
Lymphocytes Relative: 32 % (ref 12–46)
MCH: 31.6 pg (ref 26.0–34.0)
MCHC: 33.5 g/dL (ref 30.0–36.0)
MCV: 94.3 fL (ref 78.0–100.0)
MPV: 10.7 fL (ref 8.6–12.4)
Monocytes Absolute: 0.5 10*3/uL (ref 0.1–1.0)
Monocytes Relative: 8 % (ref 3–12)
NEUTROS PCT: 56 % (ref 43–77)
Neutro Abs: 3.2 10*3/uL (ref 1.7–7.7)
PLATELETS: 229 10*3/uL (ref 150–400)
RBC: 4.02 MIL/uL (ref 3.87–5.11)
RDW: 14.4 % (ref 11.5–15.5)
WBC: 5.8 10*3/uL (ref 4.0–10.5)

## 2014-07-05 LAB — WET PREP FOR TRICH, YEAST, CLUE
Trich, Wet Prep: NONE SEEN
Yeast Wet Prep HPF POC: NONE SEEN

## 2014-07-05 MED ORDER — METRONIDAZOLE 0.75 % VA GEL: VAGINAL | Status: DC

## 2014-07-05 MED ORDER — ALPRAZOLAM 0.25 MG PO TABS: 0.2500 mg | ORAL_TABLET | Freq: Every evening | ORAL | Status: DC | PRN

## 2014-07-05 MED ORDER — ESOMEPRAZOLE MAGNESIUM 40 MG PO CPDR: 40.0000 mg | DELAYED_RELEASE_CAPSULE | Freq: Every day | ORAL | Status: DC
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
## 2014-07-05 NOTE — Patient Instructions (Signed)
Bacterial Vaginosis Bacterial vaginosis is a vaginal infection that occurs when the normal balance of bacteria in the vagina is disrupted. It results from an overgrowth of certain bacteria. This is the most common vaginal infection in women of childbearing age. Treatment is important to prevent complications, especially in pregnant women, as it can cause a premature delivery. CAUSES  Bacterial vaginosis is caused by an increase in harmful bacteria that are normally present in smaller amounts in the vagina. Several different kinds of bacteria can cause bacterial vaginosis. However, the reason that the condition develops is not fully understood. RISK FACTORS Certain activities or behaviors can put you at an increased risk of developing bacterial vaginosis, including:  Having a new sex partner or multiple sex partners.  Douching.  Using an intrauterine device (IUD) for contraception. Women do not get bacterial vaginosis from toilet seats, bedding, swimming pools, or contact with objects around them. SIGNS AND SYMPTOMS  Some women with bacterial vaginosis have no signs or symptoms. Common symptoms include:  Grey vaginal discharge.  A fishlike odor with discharge, especially after sexual intercourse.  Itching or burning of the vagina and vulva.  Burning or pain with urination. DIAGNOSIS  Your health care provider will take a medical history and examine the vagina for signs of bacterial vaginosis. A sample of vaginal fluid may be taken. Your health care provider will look at this sample under a microscope to check for bacteria and abnormal cells. A vaginal pH test may also be done.  TREATMENT  Bacterial vaginosis may be treated with antibiotic medicines. These may be given in the form of a pill or a vaginal cream. A second round of antibiotics may be prescribed if the condition comes back after treatment.  HOME CARE INSTRUCTIONS   Only take over-the-counter or prescription medicines as  directed by your health care provider.  If antibiotic medicine was prescribed, take it as directed. Make sure you finish it even if you start to feel better.  Do not have sex until treatment is completed.  Tell all sexual partners that you have a vaginal infection. They should see their health care provider and be treated if they have problems, such as a mild rash or itching.  Practice safe sex by using condoms and only having one sex partner. SEEK MEDICAL CARE IF:   Your symptoms are not improving after 3 days of treatment.  You have increased discharge or pain.  You have a fever. MAKE SURE YOU:   Understand these instructions.  Will watch your condition.  Will get help right away if you are not doing well or get worse. FOR MORE INFORMATION  Centers for Disease Control and Prevention, Division of STD Prevention: [employer] American Sexual Health Association (ASHA): www.ashastd.org  Document Released: 01/18/2005 Document Revised: 11/08/2012 Document Reviewed: 08/30/2012 [facility] Patient Information 2015 [place], [place]. This information is not intended to replace advice given to you by your health care provider. Make sure you discuss any questions you have with your health care provider. Health Maintenance Adopting a healthy lifestyle and getting preventive care can go a long way to promote health and wellness. Talk with your health care provider about what schedule of regular examinations is right for you. This is a good chance for you to check in with your provider about disease prevention and staying healthy. In between checkups, there are plenty of things you can do on your own. Experts have done a lot of research about which lifestyle changes and preventive measures are most  likely to keep you healthy. Ask your health care provider for more information. WEIGHT AND DIET  Eat a healthy diet  Be sure to include plenty of vegetables, fruits, low-fat dairy products, and lean  protein.  Do not eat a lot of foods high in solid fats, added sugars, or salt.  Get regular exercise. This is one of the most important things you can do for your health.  Most adults should exercise for at least 150 minutes each week. The exercise should increase your heart rate and make you sweat (moderate-intensity exercise).  Most adults should also do strengthening exercises at least twice a week. This is in addition to the moderate-intensity exercise.  Maintain a healthy weight  Body mass index (BMI) is a measurement that can be used to identify possible weight problems. It estimates body fat based on height and weight. Your health care provider can help determine your BMI and help you achieve or maintain a healthy weight.  For females 31 years of age and older:   A BMI below 18.5 is considered underweight.  A BMI of 18.5 to 24.9 is normal.  A BMI of 25 to 29.9 is considered overweight.  A BMI of 30 and above is considered obese.  Watch levels of cholesterol and blood lipids  You should start having your blood tested for lipids and cholesterol at 48 years of age, then have this test every 5 years.  You may need to have your cholesterol levels checked more often if:  Your lipid or cholesterol levels are high.  You are older than 48 years of age.  You are at high risk for heart disease.  CANCER SCREENING   Lung Cancer  Lung cancer screening is recommended for adults 74-45 years old who are at high risk for lung cancer because of a history of smoking.  A yearly low-dose CT scan of the lungs is recommended for people who:  Currently smoke.  Have quit within the past 15 years.  Have at least a 30-pack-year history of smoking. A pack year is smoking an average of one pack of cigarettes a day for 1 year.  Yearly screening should continue until it has been 15 years since you quit.  Yearly screening should stop if you develop a health problem that would prevent you  from having lung cancer treatment.  Breast Cancer  Practice breast self-awareness. This means understanding how your breasts normally appear and feel.  It also means doing regular breast self-exams. Let your health care provider know about any changes, no matter how small.  If you are in your 20s or 30s, you should have a clinical breast exam (CBE) by a health care provider every 1-3 years as part of a regular health exam.  If you are 55 or older, have a CBE every year. Also consider having a breast X-ray (mammogram) every year.  If you have a family history of breast cancer, talk to your health care provider about genetic screening.  If you are at high risk for breast cancer, talk to your health care provider about having an MRI and a mammogram every year.  Breast cancer gene (BRCA) assessment is recommended for women who have family members with BRCA-related cancers. BRCA-related cancers include:  Breast.  Ovarian.  Tubal.  Peritoneal cancers.  Results of the assessment will determine the need for genetic counseling and BRCA1 and BRCA2 testing. Cervical Cancer Routine pelvic examinations to screen for cervical cancer are no longer recommended for nonpregnant women  who are considered low risk for cancer of the pelvic organs (ovaries, uterus, and vagina) and who do not have symptoms. A pelvic examination may be necessary if you have symptoms including those associated with pelvic infections. Ask your health care provider if a screening pelvic exam is right for you.   The Pap test is the screening test for cervical cancer for women who are considered at risk.  If you had a hysterectomy for a problem that was not cancer or a condition that could lead to cancer, then you no longer need Pap tests.  If you are older than 65 years, and you have had normal Pap tests for the past 10 years, you no longer need to have Pap tests.  If you have had past treatment for cervical cancer or a  condition that could lead to cancer, you need Pap tests and screening for cancer for at least 20 years after your treatment.  If you no longer get a Pap test, assess your risk factors if they change (such as having a new sexual partner). This can affect whether you should start being screened again.  Some women have medical problems that increase their chance of getting cervical cancer. If this is the case for you, your health care provider may recommend more frequent screening and Pap tests.  The human papillomavirus (HPV) test is another test that may be used for cervical cancer screening. The HPV test looks for the virus that can cause cell changes in the cervix. The cells collected during the Pap test can be tested for HPV.  The HPV test can be used to screen women 1 years of age and older. Getting tested for HPV can extend the interval between normal Pap tests from three to five years.  An HPV test also should be used to screen women of any age who have unclear Pap test results.  After 48 years of age, women should have HPV testing as often as Pap tests.  Colorectal Cancer  This type of cancer can be detected and often prevented.  Routine colorectal cancer screening usually begins at 48 years of age and continues through 48 years of age.  Your health care provider may recommend screening at an earlier age if you have risk factors for colon cancer.  Your health care provider may also recommend using home test kits to check for hidden blood in the stool.  A small camera at the end of a tube can be used to examine your colon directly (sigmoidoscopy or colonoscopy). This is done to check for the earliest forms of colorectal cancer.  Routine screening usually begins at age 28.  Direct examination of the colon should be repeated every 5-10 years through 48 years of age. However, you may need to be screened more often if early forms of precancerous polyps or small growths are found. Skin  Cancer  Check your skin from head to toe regularly.  Tell your health care provider about any new moles or changes in moles, especially if there is a change in a mole's shape or color.  Also tell your health care provider if you have a mole that is larger than the size of a pencil eraser.  Always use sunscreen. Apply sunscreen liberally and repeatedly throughout the day.  Protect yourself by wearing long sleeves, pants, a wide-brimmed hat, and sunglasses whenever you are outside. HEART DISEASE, DIABETES, AND HIGH BLOOD PRESSURE   Have your blood pressure checked at least every 1-2 years. High  blood pressure causes heart disease and increases the risk of stroke.  If you are between 74 years and 62 years old, ask your health care provider if you should take aspirin to prevent strokes.  Have regular diabetes screenings. This involves taking a blood sample to check your fasting blood sugar level.  If you are at a normal weight and have a low risk for diabetes, have this test once every three years after 49 years of age.  If you are overweight and have a high risk for diabetes, consider being tested at a younger age or more often. PREVENTING INFECTION  Hepatitis B  If you have a higher risk for hepatitis B, you should be screened for this virus. You are considered at high risk for hepatitis B if:  You were born in a country where hepatitis B is common. Ask your health care provider which countries are considered high risk.  Your parents were born in a high-risk country, and you have not been immunized against hepatitis B (hepatitis B vaccine).  You have HIV or AIDS.  You use needles to inject street drugs.  You live with someone who has hepatitis B.  You have had sex with someone who has hepatitis B.  You get hemodialysis treatment.  You take certain medicines for conditions, including cancer, organ transplantation, and autoimmune conditions. Hepatitis C  Blood testing is  recommended for:  Everyone born from [birth year] through 1964.  Anyone with known risk factors for hepatitis C. Sexually transmitted infections (STIs)  You should be screened for sexually transmitted infections (STIs) including gonorrhea and chlamydia if:  You are sexually active and are younger than 48 years of age.  You are older than 48 years of age and your health care provider tells you that you are at risk for this type of infection.  Your sexual activity has changed since you were last screened and you are at an increased risk for chlamydia or gonorrhea. Ask your health care provider if you are at risk.  If you do not have HIV, but are at risk, it may be recommended that you take a prescription medicine daily to prevent HIV infection. This is called pre-exposure prophylaxis (PrEP). You are considered at risk if:  You are sexually active and do not regularly use condoms or know the HIV status of your partner(s).  You take drugs by injection.  You are sexually active with a partner who has HIV. Talk with your health care provider about whether you are at high risk of being infected with HIV. If you choose to begin PrEP, you should first be tested for HIV. You should then be tested every 3 months for as long as you are taking PrEP.  PREGNANCY   If you are premenopausal and you may become pregnant, ask your health care provider about preconception counseling.  If you may become pregnant, take 400 to 800 micrograms (mcg) of folic acid every day.  If you want to prevent pregnancy, talk to your health care provider about birth control (contraception). OSTEOPOROSIS AND MENOPAUSE   Osteoporosis is a disease in which the bones lose minerals and strength with aging. This can result in serious bone fractures. Your risk for osteoporosis can be identified using a bone density scan.  If you are 42 years of age or older, or if you are at risk for osteoporosis and fractures, ask your health care  provider if you should be screened.  Ask your health care provider whether you should  take a calcium or vitamin D supplement to lower your risk for osteoporosis.  Menopause may have certain physical symptoms and risks.  Hormone replacement therapy may reduce some of these symptoms and risks. Talk to your health care provider about whether hormone replacement therapy is right for you.  HOME CARE INSTRUCTIONS   Schedule regular health, dental, and eye exams.  Stay current with your immunizations.   Do not use any tobacco products including cigarettes, chewing tobacco, or electronic cigarettes.  If you are pregnant, do not drink alcohol.  If you are breastfeeding, limit how much and how often you drink alcohol.  Limit alcohol intake to no more than 1 drink per day for nonpregnant women. One drink equals 12 ounces of beer, 5 ounces of wine, or 1 ounces of hard liquor.  Do not use street drugs.  Do not share needles.  Ask your health care provider for help if you need support or information about quitting drugs.  Tell your health care provider if you often feel depressed.  Tell your health care provider if you have ever been abused or do not feel safe at home. Document Released: 08/03/2010 Document Revised: 06/04/2013 Document Reviewed: 12/20/2012 [facility] Patient Information 2015 [place], [place]. This information is not intended to replace advice given to you by your health care provider. Make sure you discuss any questions you have with your health care provider. Insomnia Insomnia is frequent trouble falling and/or staying asleep. Insomnia can be a long term problem or a short term problem. Both are common. Insomnia can be a short term problem when the wakefulness is related to a certain stress or worry. Long term insomnia is often related to ongoing stress during waking hours and/or poor sleeping habits. Overtime, sleep deprivation itself can make the problem worse. Every little  thing feels more severe because you are overtired and your ability to cope is decreased. CAUSES   Stress, anxiety, and depression.  Poor sleeping habits.  Distractions such as TV in the bedroom.  Naps close to bedtime.  Engaging in emotionally charged conversations before bed.  Technical reading before sleep.  Alcohol and other sedatives. They may make the problem worse. They can hurt normal sleep patterns and normal dream activity.  Stimulants such as caffeine for several hours prior to bedtime.  Pain syndromes and shortness of breath can cause insomnia.  Exercise late at night.  Changing time zones may cause sleeping problems (jet lag). It is sometimes helpful to have someone observe your sleeping patterns. They should look for periods of not breathing during the night (sleep apnea). They should also look to see how long those periods last. If you live alone or observers are uncertain, you can also be observed at a sleep clinic where your sleep patterns will be professionally monitored. Sleep apnea requires a checkup and treatment. Give your caregivers your medical history. Give your caregivers observations your family has made about your sleep.  SYMPTOMS   Not feeling rested in the morning.  Anxiety and restlessness at bedtime.  Difficulty falling and staying asleep. TREATMENT   Your caregiver may prescribe treatment for an underlying medical disorders. Your caregiver can give advice or help if you are using alcohol or other drugs for self-medication. Treatment of underlying problems will usually eliminate insomnia problems.  Medications can be prescribed for short time use. They are generally not recommended for lengthy use.  Over-the-counter sleep medicines are not recommended for lengthy use. They can be habit forming.  You can promote  easier sleeping by making lifestyle changes such as:  Using relaxation techniques that help with breathing and reduce muscle  tension.  Exercising earlier in the day.  Changing your diet and the time of your last meal. No night time snacks.  Establish a regular time to go to bed.  Counseling can help with stressful problems and worry.  Soothing music and white noise may be helpful if there are background noises you cannot remove.  Stop tedious detailed work at least one hour before bedtime. HOME CARE INSTRUCTIONS   Keep a diary. Inform your caregiver about your progress. This includes any medication side effects. See your caregiver regularly. Take note of:  Times when you are asleep.  Times when you are awake during the night.  The quality of your sleep.  How you feel the next day. This information will help your caregiver care for you.  Get out of bed if you are still awake after 15 minutes. Read or do some quiet activity. Keep the lights down. Wait until you feel sleepy and go back to bed.  Keep regular sleeping and waking hours. Avoid naps.  Exercise regularly.  Avoid distractions at bedtime. Distractions include watching television or engaging in any intense or detailed activity like attempting to balance the household checkbook.  Develop a bedtime ritual. Keep a familiar routine of bathing, brushing your teeth, climbing into bed at the same time each night, listening to soothing music. Routines increase the success of falling to sleep faster.  Use relaxation techniques. This can be using breathing and muscle tension release routines. It can also include visualizing peaceful scenes. You can also help control troubling or intruding thoughts by keeping your mind occupied with boring or repetitive thoughts like the old concept of counting sheep. You can make it more creative like imagining planting one beautiful flower after another in your backyard garden.  During your day, work to eliminate stress. When this is not possible use some of the previous suggestions to help reduce the anxiety that  accompanies stressful situations. MAKE SURE YOU:   Understand these instructions.  Will watch your condition.  Will get help right away if you are not doing well or get worse. Document Released: 01/16/2000 Document Revised: 04/12/2011 Document Reviewed: 02/15/2007 [facility] Patient Information 2015 [place], [place]. This information is not intended to replace advice given to you by your health care provider. Make sure you discuss any questions you have with your health care provider.

## 2014-07-06 LAB — COMPREHENSIVE METABOLIC PANEL
ALT: <8 U/L (ref 0–35)
AST: 11 U/L (ref 0–37)
Albumin: 4.1 g/dL (ref 3.5–5.2)
Alkaline Phosphatase: 31 U/L — ABNORMAL LOW (ref 39–117)
BILIRUBIN TOTAL: 0.3 mg/dL (ref 0.2–1.2)
BUN: 9 mg/dL (ref 6–23)
CO2: 28 meq/L (ref 19–32)
Calcium: 8.4 mg/dL (ref 8.4–10.5)
Chloride: 105 mEq/L (ref 96–112)
Creat: 0.64 mg/dL (ref 0.50–1.10)
Glucose, Bld: 85 mg/dL (ref 70–99)
POTASSIUM: 4.4 meq/L (ref 3.5–5.3)
Sodium: 139 mEq/L (ref 135–145)
TOTAL PROTEIN: 6.2 g/dL (ref 6.0–8.3)

## 2014-07-06 LAB — LIPID PANEL
Cholesterol: 149 mg/dL (ref 0–200)
HDL: 52 mg/dL (ref >=46)
LDL Cholesterol: 80 mg/dL (ref 0–99)
TRIGLYCERIDES: 87 mg/dL (ref <150)
Total CHOL/HDL Ratio: 2.9 Ratio
VLDL: 17 mg/dL (ref 0–40)

## 2014-07-06 LAB — HEPATITIS C ANTIBODY: HCV Ab: NEGATIVE

## 2014-07-06 LAB — RPR

## 2014-07-06 LAB — GC/CHLAMYDIA PROBE AMP
CT Probe RNA: NEGATIVE
GC PROBE AMP APTIMA: NEGATIVE

## 2014-07-06 LAB — HIV ANTIBODY (ROUTINE TESTING W REFLEX): HIV 1&2 Ab, 4th Generation: NONREACTIVE

## 2014-07-06 LAB — TSH: TSH: 1.432 u[IU]/mL (ref 0.350–4.500)

## 2014-07-06 LAB — HEPATITIS B SURFACE ANTIGEN: Hepatitis B Surface Ag: NEGATIVE

## 2014-07-08 ENCOUNTER — Encounter: Payer: Self-pay | Admitting: Women's Health

## 2014-07-09 ENCOUNTER — Other Ambulatory Visit (HOSPITAL_COMMUNITY)
Admission: RE | Admit: 2014-07-09 | Discharge: 2014-07-09 | Disposition: A | Discharge status: Home / Self Care | Payer: BLUE CROSS/BLUE SHIELD | Source: Ambulatory Visit | Attending: Women's Health | Admitting: Women's Health

## 2014-07-09 DIAGNOSIS — Z1151 Encounter for screening for human papillomavirus (HPV): Secondary | ICD-10-CM | POA: Diagnosis present

## 2014-07-09 DIAGNOSIS — Z01411 Encounter for gynecological examination (general) (routine) with abnormal findings: Secondary | ICD-10-CM | POA: Diagnosis not present

## 2014-07-09 NOTE — Addendum Note (Signed)
Addended by: Kem ParkinsonBARNES, Berea Majkowski on: 07/09/2014 09:27 AM   Modules accepted: Orders

## 2014-07-10 LAB — CYTOLOGY - PAP

## 2015-03-17 ENCOUNTER — Telehealth: Payer: Self-pay | Admitting: *Deleted

## 2015-03-17 DIAGNOSIS — Z8041 Family history of malignant neoplasm of ovary: Secondary | ICD-10-CM

## 2015-03-17 NOTE — Telephone Encounter (Signed)
Pt called requesting to proceed with referral with genetic testing due to family history Mother, maternal grandmother ovarian cancer. Referral will be placed in epic they will contact pt to schedule, I will relay this to pt as well.

## 2015-03-17 NOTE — Telephone Encounter (Signed)
thanks

## 2015-03-18 ENCOUNTER — Telehealth: Payer: Self-pay | Admitting: Genetic Counselor

## 2015-03-18 NOTE — Telephone Encounter (Signed)
CONTACTED PT REGARDING GENETIC COUNSELING PT CONFIRMED APPT FOR 04/10/15

## 2015-03-19 NOTE — Telephone Encounter (Signed)
Appointment 04/10/15 @ 9:00am

## 2015-04-10 ENCOUNTER — Ambulatory Visit (HOSPITAL_BASED_OUTPATIENT_CLINIC_OR_DEPARTMENT_OTHER): Payer: BLUE CROSS/BLUE SHIELD | Admitting: Genetic Counselor

## 2015-04-10 ENCOUNTER — Encounter: Payer: Self-pay | Admitting: Genetic Counselor

## 2015-04-10 ENCOUNTER — Other Ambulatory Visit: Payer: BLUE CROSS/BLUE SHIELD

## 2015-04-10 DIAGNOSIS — Z8041 Family history of malignant neoplasm of ovary: Secondary | ICD-10-CM | POA: Diagnosis not present

## 2015-04-10 DIAGNOSIS — Z806 Family history of leukemia: Secondary | ICD-10-CM

## 2015-04-10 DIAGNOSIS — Z8049 Family history of malignant neoplasm of other genital organs: Secondary | ICD-10-CM | POA: Insufficient documentation

## 2015-04-10 DIAGNOSIS — Z801 Family history of malignant neoplasm of trachea, bronchus and lung: Secondary | ICD-10-CM

## 2015-04-10 DIAGNOSIS — Z803 Family history of malignant neoplasm of breast: Secondary | ICD-10-CM | POA: Diagnosis not present

## 2015-04-10 DIAGNOSIS — Z315 Encounter for genetic counseling: Secondary | ICD-10-CM

## 2015-04-10 NOTE — Progress Notes (Signed)
REFERRING PROVIDER: YOUNG, Izora Gala, NP  PRIMARY PROVIDER:  No primary care provider on file.  PRIMARY REASON FOR VISIT:  1. Family history of breast cancer in female   2. Family history of ovarian cancer   3. Family history of uterine cancer   4. Family history of leukemia   5. Family history of lung cancer      HISTORY OF PRESENT ILLNESS:   [name], a 50 y.o. female, was seen for a [place] cancer genetics consultation at the request of Elon Alas due to a family history of early-onset breast, ovarian, uterine, and other cancers.  Ms. [name] presents to clinic today to discuss the possibility of a hereditary predisposition to cancer, genetic testing, and to further clarify her future cancer risks, as well as potential cancer risks for family members.    Ms. [name] is a 50 y.o. female with no personal history of cancer.    CANCER HISTORY:   No history exists.     HORMONAL RISK FACTORS:  Menarche was at age 39.  First live birth at age 29.  OCP use for approximately 3 years (Mirena IUD) Ovaries intact: yes.  Hysterectomy: no.  Menopausal status: premenopausal.  HRT use: 0 years. Colonoscopy: no; not examined. Mammogram within the last year: yes. Number of breast biopsies: 0. Up to date with pelvic exams:  yes. Any excessive radiation exposure in the past:  no  History reviewed. No pertinent past medical history.  Past Surgical History  Procedure Laterality Date  . Tympanostomy tube placement      AGE 48  . Tonsillectomy      AGE 43    Social History   Social History  . Marital Status: Divorced    Spouse Name: N/A  . Number of Children: N/A  . Years of Education: N/A   Social History Main Topics  . Smoking status: Current Every Day Smoker -- 1.00 packs/day for 20 years    Types: Cigarettes  . Smokeless tobacco: Never Used  . Alcohol Use: Yes     Comment: SOCIALLY  . Drug Use: No  . Sexual Activity: Not Currently    Birth Control/ Protection: None    Other Topics Concern  . None   Social History Narrative     FAMILY HISTORY:  We obtained a detailed, 4-generation family history.  Significant diagnoses are listed below: Family History  Problem Relation Age of Onset  . Ovarian cancer Mother 60  . Diabetes Father   . Hypertension Father   . Anemia Father     myelodysplastic anemia  . Ovarian cancer Maternal Grandmother 4  . Breast cancer Paternal Grandmother     dx. less than 61  . Uterine cancer Paternal Grandmother 97  . Other Sister     has had genetic testing  . Lung cancer Paternal Aunt     dx. early to mid-60s; smoker  . Heart attack Maternal Grandfather   . Heart attack Paternal Grandfather   . Other Sister     has had genetic testing - VUS in the ATM gene  . Other Sister     has had negative genetic testing  . Other Sister     no genetic testing yet  . Heart attack Other     MGF's sister  . Heart attack Other     MGF's brother  . Leukemia Paternal Aunt 57    Ms. [name] has one daughter who is currently 37.  Ms. [name] has four full sisters (  all between the ages of 53-52), three of whom have already had genetic testing.  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  Ms. Trabucco sister Belenda Cruise has not yet had genetic testing.  None of Ms. Noyce's sisters have been diagnosed with cancer.  Ms. [name] mother died of ovarian cancer at 59; she was first diagnosed at age 38.  Ms. [name] father died at the age of 26; he had a history of myelodysplastic anemia.    Ms. [name] mother had four full brothers, all of whom are still living and are between their late 50s-70s.  None of these maternal uncles have ever had cancer.  These uncles have children of their own--boys and girls--between  the ages of 83-52.  None of these maternal first cousins have a history of cancer.  Ms. [name] maternal grandmother died of ovarian cancer at the age of 52; she was first diagnosed at the age of 85.  Ms. [name] maternal grandmother had one brother and sister and neither of these siblings were ever diagnosed with cancer.  Ms. [name] maternal grandfather died of a heart attack at the age fo 71.  He also had one brother and sister--both of whom died of heart attacks in their late 61s-70s.    Ms. [name] father had three full brothers and two full sisters.  Only one brother is still living; he is 29 and has never had cancer.  The other brothers died in their 70s--neither had cancer.  One sister died of Leukemia at the age fo 18.  She has two sons who have not been diagnosed with cancer.  The other sister died of lung cancer in her early-mid-60s; she was a smoker.  Ms. [name] paternal grandmother was diagnosed with uterine cancer at the age of 65; she was also diagnosed with breast cancer younger than age 39.  This grandmother died of "metastatic uterine cancer" at the age of 54.  Ms. [name] father died of a heart attack at the age of 67.  Ms. [name] has no further information for any of her paternal great aunts/uncles or great grandparents.  She reports that no other, relatives have had genetic testing beyond that of her sisters.  Patient's maternal ancestors are of [place], [place], and [place] descent, and paternal ancestors are of [place] descent. There is no reported Ashkenazi Jewish ancestry. There is no known consanguinity.  GENETIC COUNSELING ASSESSMENT: [name] is a 50 y.o. female with a family history of ovarian, breast, and uterine cancers which is somewhat suggestive of a hereditary cancer syndrome and predisposition to cancer. We, therefore, discussed and recommended the following at today's visit.   DISCUSSION: We reviewed the characteristics, features and inheritance patterns of  hereditary cancer syndromes, particularly those caused by mutations within the BRCA1/2 and Lynch syndrome genes. We also discussed genetic testing, including the appropriate family members to test, the process of testing, insurance coverage and turn-around-time for results. We discussed the implications of a negative, positive and/or variant of uncertain significant result. We recommended Ms. Carlota Raspberry pursue genetic testing for the 25-gene OvaNext Panel through [facility].  The 25-gene OvaNext Panel performed by [facility], [place]) includes sequencing and/or deletion/duplication analysis of the following genes: ATM, BARD1, BRCA1, BRCA2, BRIP1, CDH1, CHEK2,  DICER1, EPCAM, MLH1, MRE11A, MSH2, MSH6, MUTYH, NBN, NF1, PALB2, PMS2, PTEN, RAD50, RAD51C, RAD51D, SMARCA4, STK11, and TP53.     Based on Ms. Shackett's family history of cancer, she meets medical criteria for genetic testing. Despite that she meets criteria, she may still have an out of pocket cost. We discussed that if her out of pocket cost for testing is over $100, the laboratory will call and confirm whether she wants to proceed with testing.  If the out of pocket cost of testing is less than $100 she will be billed by the genetic testing laboratory.   Based on the patient's personal and family history, statistical models (Claus and Tyer-Cuzick/IBIS)  and literature data were used to estimate her risk of developing breast cancer. These estimate her lifetime risk of developing breast cancer to be approximately 10% to 19.0%. This estimation does not take into account any genetic testing results.  The patient's lifetime breast cancer risk is a preliminary estimate based on available information using one of several models endorsed by the [place] (ACS). The ACS recommends consideration of breast MRI screening as an adjunct to mammography for patients at high risk (defined as 20% or greater lifetime risk). A more  detailed breast cancer risk assessment can be considered, if clinically indicated.   PLAN: After considering the risks, benefits, and limitations, Ms. Nass  provided informed consent to pursue genetic testing and the blood sample was sent to [facility] for analysis of the 25-gene OvaNext Panel test. Results should be available within approximately 3-4 weeks' time, at which point they will be disclosed by telephone to Ms. Carlota Raspberry, as will any additional recommendations warranted by these results. Ms. Brevik will receive a summary of her genetic counseling visit and a copy of her results once available. This information will also be available in Epic. We encouraged Ms. Carlota Raspberry to remain in contact with cancer genetics annually so that we can continuously update the family history and inform her of any changes in cancer genetics and testing that may be of benefit for her family. Ms. Toepfer questions were answered to her satisfaction today. Our contact information was provided should additional questions or concerns arise.  Thank you for the referral and allowing [place] to share in the care of your patient.   Jeanine Luz, MS, [facility] Certified Genetic Counselor [email or web address] Phone: [phone]  The patient was seen for a total of 60 minutes in face-to-face genetic counseling.  This patient was discussed with Drs. Magrinat, Lindi Adie and/or Burr Medico who agrees with the above.    _______________________________________________________________________ For Office Staff:  Number of people involved in session: 1 Was an Intern/ student involved with case: no

## 2015-04-30 ENCOUNTER — Telehealth: Payer: Self-pay | Admitting: Genetic Counselor

## 2015-04-30 NOTE — Telephone Encounter (Signed)
Discussed with [name] that her genetic test result was negative for any pathogenic mutations within any of 25 genes on the OvaNext Panel through [facility].  Additionally, no uncertain changes were found.  Discussed that this is most likely a reassuring result for us, though we cannot totally rule out a genetic cause for the ovarian, breast, and uterine cancers in the family.  Our testing still might not be good enough to identify a genetic cause for the history of cancer or there could still be a genetic cause for the family history of cancer that [name] and her sisters (those who have had negative testing so far) just did not inherit themselves.  Discussed that Angela Cooper's younger sister, Angela Cooper, should still have genetic counseling and testing.  Encouraged [name] to contact us to update the family history and to find out about updated genetic testing options in the future.  She is always welcome to call with any questions.  She should continue to follow her doctors' recommendations for future cancer screening.  [name] would like a copy of her test result and a results letter, and I am happy to provide this.  She would like this sent to her home address.

## 2015-05-01 ENCOUNTER — Ambulatory Visit: Payer: Self-pay | Admitting: Genetic Counselor

## 2015-05-01 DIAGNOSIS — Z8041 Family history of malignant neoplasm of ovary: Secondary | ICD-10-CM

## 2015-05-01 DIAGNOSIS — Z809 Family history of malignant neoplasm, unspecified: Secondary | ICD-10-CM

## 2015-05-01 DIAGNOSIS — Z8049 Family history of malignant neoplasm of other genital organs: Secondary | ICD-10-CM

## 2015-05-01 DIAGNOSIS — Z1379 Encounter for other screening for genetic and chromosomal anomalies: Secondary | ICD-10-CM

## 2015-05-01 DIAGNOSIS — Z803 Family history of malignant neoplasm of breast: Secondary | ICD-10-CM

## 2015-05-05 ENCOUNTER — Encounter: Payer: Self-pay | Admitting: Genetic Counselor

## 2015-05-05 DIAGNOSIS — Z1379 Encounter for other screening for genetic and chromosomal anomalies: Secondary | ICD-10-CM | POA: Insufficient documentation

## 2015-05-05 NOTE — Progress Notes (Signed)
GENETIC TEST RESULT  HPI: Ms. Brickner was previously seen in the [facility] clinic due to a family history of ovarian, breast, uterine, and other cancers, and concerns regarding a hereditary predisposition to cancer. Please refer to our prior cancer genetics clinic note from April 10, 2015 for more information regarding Ms. Riojas's medical, social and family histories, and our assessment and recommendations, at the time. Ms. Shishido recent genetic test results were disclosed to her, as were recommendations warranted by these results. These results and recommendations are discussed in more detail below.  GENETIC TEST RESULTS: At the time of Ms. Repinski's visit on 04/10/15, we recommended she pursue genetic testing of the 25-gene OvaNext Panel through [facility].  The 25-gene OvaNext Panel performed by [facility], [place]) includes sequencing and deletion/duplication analysis of the following 24 genes: ATM, BARD1, BRCA1, BRCA2, BRIP1, CDH1, CHEK2, DICER1, MLH1, MRE11A, MSH2, MSH6, MUTYH, NBN, NF1, PALB2, PMS2, PTEN, RAD50, RAD51C, RAD51D, SMARCA4, STK11, and TP53.  This panel also includes deletion/duplication analysis (without sequencing) for one gene, EPCAM.  Those results are now back, the report date for which is April 30, 2015.  Genetic testing was normal, and did not reveal a deleterious mutation in these genes.  Additionally, no variants of uncertain significance (VUSes) were found.  The test report will be scanned into EPIC and will be located under the Results Review tab in the Pathology>Molecular Pathology section.   We discussed with Ms. Strain that since the current genetic testing is not perfect, it is possible there may be a gene mutation in one of these genes that current testing cannot detect, but that chance is small. We also discussed, that it is possible that another gene that has not yet been discovered, or that we have not yet tested, is  responsible for the cancer diagnoses in the family, and it is, therefore, important to remain in touch with cancer genetics in the future so that we can continue to offer Ms. Granville the most up to date genetic testing.   CANCER SCREENING RECOMMENDATIONS: This normal result may be reassuring and indicate that Ms. Grist does not likely have an increased risk of cancer due to a mutation in one of these genes.  However, it may also be the case that our current genetic testing is just not good enough to identify a genetic cause for the family history of cancer.  We recommended  Ms. Fuelling continue to follow the cancer screening guidelines provided by her primary healthcare providers.  We also encourage her to remain in contact with [place] in the future, so that we can continue to offer her the most updated genetic testing options.  RECOMMENDATIONS FOR FAMILY MEMBERS: Women in this family might be at some increased risk of developing cancer, over the general population risk, simply due to the family history of cancer. We recommended women in this family have a yearly mammogram beginning at age 21, or 23 years younger than the earliest onset of cancer, an an annual clinical breast exam, and perform monthly breast self-exams. Women in this family should also have a gynecological exam as recommended by their primary provider. All family members should have a colonoscopy by age 66.  Most of Ms. Rosal's sisters have already had genetic testing (no known pathogenic mutations identified).  The only sister who has not yet had testing is Ms. Zylka's sister, Belenda Cruise.  Thus, we recommend that Belenda Cruise have genetic counseling and testing. Ms. Gillock will let us  know if we can be of any assistance in coordinating genetic counseling and/or testing for her sister.  FOLLOW-UP: Lastly, we discussed with Ms. Carlota Raspberry that cancer genetics is a rapidly advancing field and it is possible that new genetic tests will be appropriate  for her and/or her family members in the future. We encouraged her to remain in contact with cancer genetics on an annual basis so we can update her personal and family histories and let her know of advances in cancer genetics that may benefit this family.   Our contact number was provided. Ms. Urschel questions were answered to her satisfaction, and she knows she is welcome to call us at anytime with additional questions or concerns.   Jeanine Luz, MS, [facility] Certified Genetic Counselor [email or web address] Phone: [phone]

## 2015-05-19 ENCOUNTER — Other Ambulatory Visit: Payer: Self-pay | Admitting: Family Medicine

## 2015-05-19 ENCOUNTER — Ambulatory Visit
Admission: RE | Admit: 2015-05-19 | Discharge: 2015-05-19 | Disposition: A | Discharge status: Home / Self Care | Payer: BLUE CROSS/BLUE SHIELD | Source: Ambulatory Visit | Attending: Family Medicine | Admitting: Family Medicine

## 2015-05-19 DIAGNOSIS — M25511 Pain in right shoulder: Secondary | ICD-10-CM

## 2015-05-19 DIAGNOSIS — M19011 Primary osteoarthritis, right shoulder: Secondary | ICD-10-CM | POA: Diagnosis not present

## 2015-05-30 ENCOUNTER — Other Ambulatory Visit: Payer: Self-pay

## 2015-05-30 DIAGNOSIS — Z1231 Encounter for screening mammogram for malignant neoplasm of breast: Secondary | ICD-10-CM

## 2015-07-10 ENCOUNTER — Ambulatory Visit: Payer: BLUE CROSS/BLUE SHIELD

## 2015-07-15 ENCOUNTER — Other Ambulatory Visit: Payer: Self-pay | Admitting: Women's Health

## 2015-07-15 ENCOUNTER — Ambulatory Visit
Admission: RE | Admit: 2015-07-15 | Discharge: 2015-07-15 | Disposition: A | Discharge status: Home / Self Care | Payer: BLUE CROSS/BLUE SHIELD | Source: Ambulatory Visit

## 2015-07-15 DIAGNOSIS — Z1231 Encounter for screening mammogram for malignant neoplasm of breast: Secondary | ICD-10-CM

## 2015-07-21 ENCOUNTER — Ambulatory Visit (INDEPENDENT_AMBULATORY_CARE_PROVIDER_SITE_OTHER): Payer: BLUE CROSS/BLUE SHIELD | Admitting: Women's Health

## 2015-07-21 ENCOUNTER — Encounter: Payer: Self-pay | Admitting: Women's Health

## 2015-07-21 VITALS — BP 110/78 | Ht 68.0 in | Wt 200.0 lb

## 2015-07-21 DIAGNOSIS — Z01419 Encounter for gynecological examination (general) (routine) without abnormal findings: Secondary | ICD-10-CM

## 2015-07-21 DIAGNOSIS — F419 Anxiety disorder, unspecified: Secondary | ICD-10-CM | POA: Diagnosis not present

## 2015-07-21 DIAGNOSIS — N72 Inflammatory disease of cervix uteri: Secondary | ICD-10-CM | POA: Diagnosis not present

## 2015-07-21 DIAGNOSIS — Z113 Encounter for screening for infections with a predominantly sexual mode of transmission: Secondary | ICD-10-CM

## 2015-07-21 LAB — COMPREHENSIVE METABOLIC PANEL
ALK PHOS: 34 U/L (ref 33–115)
ALT: 9 U/L (ref 6–29)
AST: 10 U/L (ref 10–35)
Albumin: 4 g/dL (ref 3.6–5.1)
BILIRUBIN TOTAL: 0.5 mg/dL (ref 0.2–1.2)
BUN: 10 mg/dL (ref 7–25)
CO2: 27 mmol/L (ref 20–31)
CREATININE: 0.62 mg/dL (ref 0.50–1.10)
Calcium: 8.5 mg/dL — ABNORMAL LOW (ref 8.6–10.2)
Chloride: 102 mmol/L (ref 98–110)
GLUCOSE: 86 mg/dL (ref 65–99)
Potassium: 4.1 mmol/L (ref 3.5–5.3)
Sodium: 137 mmol/L (ref 135–146)
Total Protein: 6.3 g/dL (ref 6.1–8.1)

## 2015-07-21 LAB — URINALYSIS W MICROSCOPIC + REFLEX CULTURE
BACTERIA UA: NONE SEEN [HPF]
Bilirubin Urine: NEGATIVE
CASTS: NONE SEEN [LPF]
CRYSTALS: NONE SEEN [HPF]
Glucose, UA: NEGATIVE
Hgb urine dipstick: NEGATIVE
KETONES UR: NEGATIVE
LEUKOCYTES UA: NEGATIVE
Nitrite: NEGATIVE
Protein, ur: NEGATIVE
RBC / HPF: NONE SEEN RBC/HPF (ref <=2)
SPECIFIC GRAVITY, URINE: 1.019 (ref 1.001–1.035)
WBC, UA: NONE SEEN WBC/HPF (ref <=5)
YEAST: NONE SEEN [HPF]
pH: 6 (ref 5.0–8.0)

## 2015-07-21 LAB — HEPATITIS B SURFACE ANTIGEN: Hepatitis B Surface Ag: NEGATIVE

## 2015-07-21 LAB — CBC WITH DIFFERENTIAL/PLATELET
BASOS PCT: 0 %
Basophils Absolute: 0 cells/uL (ref 0–200)
Eosinophils Absolute: 153 cells/uL (ref 15–500)
Eosinophils Relative: 3 %
HEMATOCRIT: 37.7 % (ref 35.0–45.0)
Hemoglobin: 12.8 g/dL (ref 11.7–15.5)
LYMPHS PCT: 30 %
Lymphs Abs: 1530 cells/uL (ref 850–3900)
MCH: 30.9 pg (ref 27.0–33.0)
MCHC: 34 g/dL (ref 32.0–36.0)
MCV: 91.1 fL (ref 80.0–100.0)
MONO ABS: 561 {cells}/uL (ref 200–950)
MONOS PCT: 11 %
MPV: 10.4 fL (ref 7.5–12.5)
NEUTROS ABS: 2856 {cells}/uL (ref 1500–7800)
Neutrophils Relative %: 56 %
PLATELETS: 235 10*3/uL (ref 140–400)
RBC: 4.14 MIL/uL (ref 3.80–5.10)
RDW: 14.1 % (ref 11.0–15.0)
WBC: 5.1 10*3/uL (ref 3.8–10.8)

## 2015-07-21 LAB — HEPATITIS C ANTIBODY: HCV Ab: NEGATIVE

## 2015-07-21 LAB — HIV ANTIBODY (ROUTINE TESTING W REFLEX): HIV 1&2 Ab, 4th Generation: NONREACTIVE

## 2015-07-21 MED ORDER — ALPRAZOLAM 0.25 MG PO TABS: 0.2500 mg | ORAL_TABLET | Freq: Every evening | ORAL | Status: DC | PRN

## 2015-07-21 NOTE — Addendum Note (Signed)
Addended by: Kem ParkinsonBARNES, Kately Graffam on: 07/21/2015 10:12 AM   Modules accepted: Orders

## 2015-07-21 NOTE — Progress Notes (Signed)
Angela Cooper 25-Jan-1966 [id number]    History:    Presents for annual exam.  Rare bleeding with Mirena IUD placed 07/2012. Normal mammogram history. 2014 LGSIL with a negative colposcopy and biopsy, 2015 normal Pap with negative high-risk HPV, 2016 ASCUS with negative high risk HPV. Questionable fidelity of expartner. Mother and maternal grandmother ovarian cancer after menopause.  Past medical history, past surgical history, family history and social history were all reviewed and documented in the EPIC chart. Works at the [employer]. [employer] [address] [place] doing okay. Father diabetes and hypertension. Parents deceased.  ROS:  A ROS was performed and pertinent positives and negatives are included.  Exam:  Filed Vitals:   07/21/15 0818  BP: 110/78    General appearance:  Normal Thyroid:  Symmetrical, normal in size, without palpable masses or nodularity. Respiratory  Auscultation:  Clear without wheezing or rhonchi Cardiovascular  Auscultation:  Regular rate, without rubs, murmurs or gallops  Edema/varicosities:  Not grossly evident Abdominal  Soft,nontender, without masses, guarding or rebound.  Liver/spleen:  No organomegaly noted  Hernia:  None appreciated  Skin  Inspection:  Grossly normal   Breasts: Examined lying and sitting.     Right: Without masses, retractions, discharge or axillary adenopathy.     Left: Without masses, retractions, discharge or axillary adenopathy. Gentitourinary   Inguinal/mons:  Normal without inguinal adenopathy  External genitalia:  Normal  BUS/Urethra/Skene's glands:  Normal  Vagina:  Normal  Cervix:  Normal IUD strings visible  Uterus:   normal in size, shape and contour.  Midline and mobile  Adnexa/parametria:     Rt: Without masses or tenderness.   Lt: Without masses or tenderness.  Anus and perineum: Normal  Digital rectal exam: Normal sphincter tone without palpated masses or tenderness  Assessment/Plan:  50 y.o. DW F G1 P1  for  annual exam with no complaints.  Rare bleeding Mirena IUD/2014 2014 LGSIL with normal Paps after STD screen Smoking cessation Situational stress/anxiety  Plan: SBE's, annual mammogram, 3-D tomography reviewed and encouraged history of dense breasts. Exercise, calcium rich diet, vitamin D 1000 daily encouraged. Has gained 12 pounds in the past year, discussed importance of cutting calories. Process of quitting cigarettes using NicoDerm patches has been smoke free for 2 weeks. Xanax 0.25 by mouth at bedtime when necessary aware to use sparingly and addictive properties. Normal lipid panel 2016, CBC, CMP, UA, GC/Chlamydia, HIV, hep B, C, RPR. Pap  Angela ChallengerYOUNG,Angela Cooper J Fairview HospitalWHNP, 8:53 AM 07/21/2015

## 2015-07-22 LAB — RPR

## 2015-07-23 ENCOUNTER — Other Ambulatory Visit: Payer: Self-pay | Admitting: Women's Health

## 2015-07-23 DIAGNOSIS — Z113 Encounter for screening for infections with a predominantly sexual mode of transmission: Secondary | ICD-10-CM

## 2015-07-23 LAB — PAP, TP IMAGING W/ HPV RNA, RFLX HPV TYPE 16,18/45: HPV mRNA, High Risk: NOT DETECTED

## 2015-11-25 ENCOUNTER — Encounter: Payer: Self-pay | Admitting: Podiatry

## 2015-11-25 ENCOUNTER — Ambulatory Visit (INDEPENDENT_AMBULATORY_CARE_PROVIDER_SITE_OTHER): Payer: BLUE CROSS/BLUE SHIELD | Admitting: Podiatry

## 2015-11-25 DIAGNOSIS — L600 Ingrowing nail: Secondary | ICD-10-CM | POA: Insufficient documentation

## 2015-11-25 NOTE — Progress Notes (Signed)
   Subjective:    Patient ID: Angela Cooper, female    DOB: 03/17/1965, 50 y.o.   MRN: [id number]  HPI   50 year old female presents the office if or concerns or encroachment on the left second toe which is been ongoing for the last several months. She just had a pedicure done and they did get out the skin of the corner of the nail and it is no longer painful however she says as the nail grows it will become painful again. She elected to have a procedure gentry remove the ingrown portion the toenail. She currently denies any redness, drainage or any swelling.  Review of Systems  All other systems reviewed and are negative.      Objective:   Physical Exam General: AAO x3, NAD  Dermatological: There is incurvation along the medial aspect of the left second digit toenail with tenderness to palpation. There is no edema, erythema, drainage or pus recess infection. Is mild incurvation along the lateral nail border however month and a mole there is no tenderness. Also incurvation of the right second digit toenail this has been ongoing for some time this no pain or any signs of infection. No open lesions or pre-ulcerative lesions.   Vascular: Dorsalis Pedis artery and Posterior Tibial artery pedal pulses are 2/4 bilateral with immedate capillary fill time.  There is no pain with calf compression, swelling, warmth, erythema.   Neruologic: Grossly intact via light touch bilateral. Vibratory intact via tuning fork bilateral. Protective threshold with Semmes Wienstein monofilament intact to all pedal sites bilateral.  Musculoskeletal: No gross boney pedal deformities bilateral. No pain, crepitus, or limitation noted with foot and ankle range of motion bilateral. Muscular strength 5/5 in all groups tested bilateral.  Gait: Unassisted, Nonantalgic.      Assessment & Plan:  50 year old female with  left second digit medial some to medical ingrown toenail -Treatment options discussed including all  alternatives, risks, and complications -Etiology of symptoms were discussed -At this time we discussed partial nail avulsion left. However is not painful this time she has a significant coming this week. She once had the procedure done but she'll wait till after her event is over with. We will plan on doing the procedure next couple weeks. An offloading pad was dispensed the meantime. Monitor for infection or any worsening to call the office sooner if needed.   Ovid CurdMatthew Wagoner, DPM

## 2015-11-25 NOTE — Patient Instructions (Signed)
Ingrown Toenail  An ingrown toenail occurs when the corner or sides of your toenail grow into the surrounding skin. The big toe is most commonly affected, but it can happen to any of your toes. If your ingrown toenail is not treated, you will be at risk for infection.  CAUSES  This condition may be caused by:  · Wearing shoes that are too small or tight.  · Injury or trauma, such as stubbing your toe or having your toe stepped on.  · Improper cutting or care of your toenails.  · Being born with (congenital) nail or foot abnormalities, such as having a nail that is too big for your toe.  RISK FACTORS  Risk factors for an ingrown toenail include:  · Age. Your nails tend to thicken as you get older, so ingrown nails are more common in older people.  · Diabetes.  · Cutting your toenails incorrectly.  · Blood circulation problems.  SYMPTOMS  Symptoms may include:  · Pain, soreness, or tenderness.  · Redness.  · Swelling.  · Hardening of the skin surrounding the toe.  Your ingrown toenail may be infected if there is fluid, pus, or drainage.  DIAGNOSIS   An ingrown toenail may be diagnosed by medical history and physical exam. If your toenail is infected, your health care provider may test a sample of the drainage.  TREATMENT  Treatment depends on the severity of your ingrown toenail. Some ingrown toenails may be treated at home. More severe or infected ingrown toenails may require surgery to remove all or part of the nail. Infected ingrown toenails may also be treated with antibiotic medicines.  HOME CARE INSTRUCTIONS  · If you were prescribed an antibiotic medicine, finish all of it even if you start to feel better.  · Soak your foot in warm soapy water for 20 minutes, 3 times per day or as directed by your health care provider.  · Carefully lift the edge of the nail away from the sore skin by wedging a small piece of cotton under the corner of the nail. This may help with the pain.  Be careful not to cause more injury  to the area.  · Wear shoes that fit well. If your ingrown toenail is causing you pain, try wearing sandals, if possible.  · Trim your toenails regularly and carefully. Do not cut them in a curved shape. Cut your toenails straight across. This prevents injury to the skin at the corners of the toenail.  · Keep your feet clean and dry.  · If you are having trouble walking and are given crutches by your health care provider, use them as directed.  · Do not pick at your toenail or try to remove it yourself.  · Take medicines only as directed by your health care provider.  · Keep all follow-up visits as directed by your health care provider. This is important.  SEEK MEDICAL CARE IF:  · Your symptoms do not improve with treatment.  SEEK IMMEDIATE MEDICAL CARE IF:  · You have red streaks that start at your foot and go up your leg.  · You have a fever.  · You have increased redness, swelling, or pain.  · You have fluid, blood, or pus coming from your toenail.     This information is not intended to replace advice given to you by your health care provider. Make sure you discuss any questions you have with your health care provider.     Document Released:   01/16/2000 Document Revised: 06/04/2014 Document Reviewed: 12/12/2013  Elsevier Interactive Patient Education ©2016 Elsevier Inc.

## 2016-01-19 ENCOUNTER — Other Ambulatory Visit: Payer: Self-pay | Admitting: Family Medicine

## 2016-01-19 DIAGNOSIS — M5431 Sciatica, right side: Secondary | ICD-10-CM

## 2016-01-19 DIAGNOSIS — M5432 Sciatica, left side: Principal | ICD-10-CM

## 2016-01-27 ENCOUNTER — Ambulatory Visit
Admission: RE | Admit: 2016-01-27 | Discharge: 2016-01-27 | Disposition: A | Discharge status: Home / Self Care | Payer: BLUE CROSS/BLUE SHIELD | Source: Ambulatory Visit | Attending: Family Medicine | Admitting: Family Medicine

## 2016-01-27 ENCOUNTER — Other Ambulatory Visit: Payer: Self-pay | Admitting: Family Medicine

## 2016-01-27 ENCOUNTER — Other Ambulatory Visit: Payer: Self-pay

## 2016-01-27 DIAGNOSIS — M48061 Spinal stenosis, lumbar region without neurogenic claudication: Secondary | ICD-10-CM | POA: Diagnosis not present

## 2016-01-27 DIAGNOSIS — M5431 Sciatica, right side: Secondary | ICD-10-CM

## 2016-02-23 DIAGNOSIS — M5126 Other intervertebral disc displacement, lumbar region: Secondary | ICD-10-CM | POA: Diagnosis not present

## 2016-02-23 DIAGNOSIS — Z6835 Body mass index (BMI) 35.0-35.9, adult: Secondary | ICD-10-CM | POA: Diagnosis not present

## 2016-02-23 DIAGNOSIS — M5417 Radiculopathy, lumbosacral region: Secondary | ICD-10-CM | POA: Diagnosis not present

## 2016-03-04 DIAGNOSIS — M5417 Radiculopathy, lumbosacral region: Secondary | ICD-10-CM | POA: Diagnosis not present

## 2016-03-06 DIAGNOSIS — M5417 Radiculopathy, lumbosacral region: Secondary | ICD-10-CM | POA: Diagnosis not present

## 2016-03-08 ENCOUNTER — Other Ambulatory Visit: Payer: Self-pay

## 2016-03-08 DIAGNOSIS — F419 Anxiety disorder, unspecified: Secondary | ICD-10-CM

## 2016-03-08 MED ORDER — ALPRAZOLAM 0.25 MG PO TABS: 0.2500 mg | ORAL_TABLET | Freq: Every evening | ORAL | 0 refills | Status: DC | PRN

## 2016-03-09 DIAGNOSIS — M5126 Other intervertebral disc displacement, lumbar region: Secondary | ICD-10-CM | POA: Diagnosis not present

## 2016-03-09 DIAGNOSIS — Z6835 Body mass index (BMI) 35.0-35.9, adult: Secondary | ICD-10-CM | POA: Diagnosis not present

## 2016-03-09 DIAGNOSIS — M5417 Radiculopathy, lumbosacral region: Secondary | ICD-10-CM | POA: Diagnosis not present

## 2016-03-09 NOTE — Telephone Encounter (Signed)
Called into pharmacy

## 2016-03-11 DIAGNOSIS — M5417 Radiculopathy, lumbosacral region: Secondary | ICD-10-CM | POA: Diagnosis not present

## 2016-03-24 DIAGNOSIS — Z6834 Body mass index (BMI) 34.0-34.9, adult: Secondary | ICD-10-CM | POA: Diagnosis not present

## 2016-03-24 DIAGNOSIS — M5126 Other intervertebral disc displacement, lumbar region: Secondary | ICD-10-CM | POA: Diagnosis not present

## 2016-03-24 DIAGNOSIS — R030 Elevated blood-pressure reading, without diagnosis of hypertension: Secondary | ICD-10-CM | POA: Diagnosis not present

## 2016-04-21 DIAGNOSIS — M5126 Other intervertebral disc displacement, lumbar region: Secondary | ICD-10-CM | POA: Diagnosis not present

## 2016-04-21 DIAGNOSIS — Z6834 Body mass index (BMI) 34.0-34.9, adult: Secondary | ICD-10-CM | POA: Diagnosis not present

## 2016-05-04 DIAGNOSIS — M5417 Radiculopathy, lumbosacral region: Secondary | ICD-10-CM | POA: Diagnosis not present

## 2016-05-04 DIAGNOSIS — M5126 Other intervertebral disc displacement, lumbar region: Secondary | ICD-10-CM | POA: Diagnosis not present

## 2016-06-16 ENCOUNTER — Encounter: Payer: Self-pay | Admitting: Gynecology

## 2016-07-19 DIAGNOSIS — L309 Dermatitis, unspecified: Secondary | ICD-10-CM | POA: Diagnosis not present

## 2016-07-19 DIAGNOSIS — L308 Other specified dermatitis: Secondary | ICD-10-CM | POA: Diagnosis not present

## 2016-08-03 ENCOUNTER — Ambulatory Visit (INDEPENDENT_AMBULATORY_CARE_PROVIDER_SITE_OTHER): Payer: BLUE CROSS/BLUE SHIELD | Admitting: Women's Health

## 2016-08-03 ENCOUNTER — Encounter: Payer: Self-pay | Admitting: Women's Health

## 2016-08-03 VITALS — BP 126/80 | Ht 66.5 in | Wt 212.0 lb

## 2016-08-03 DIAGNOSIS — Z13220 Encounter for screening for lipoid disorders: Secondary | ICD-10-CM

## 2016-08-03 DIAGNOSIS — Z01419 Encounter for gynecological examination (general) (routine) without abnormal findings: Secondary | ICD-10-CM | POA: Diagnosis not present

## 2016-08-03 DIAGNOSIS — F419 Anxiety disorder, unspecified: Secondary | ICD-10-CM | POA: Diagnosis not present

## 2016-08-03 DIAGNOSIS — N72 Inflammatory disease of cervix uteri: Secondary | ICD-10-CM | POA: Diagnosis not present

## 2016-08-03 LAB — CBC WITH DIFFERENTIAL/PLATELET
BASOS ABS: 0 {cells}/uL (ref 0–200)
Basophils Relative: 0 %
EOS PCT: 2 %
Eosinophils Absolute: 146 cells/uL (ref 15–500)
HCT: 35.5 % (ref 35.0–45.0)
HEMOGLOBIN: 12 g/dL (ref 11.7–15.5)
LYMPHS PCT: 28 %
Lymphs Abs: 2044 cells/uL (ref 850–3900)
MCH: 31.6 pg (ref 27.0–33.0)
MCHC: 33.8 g/dL (ref 32.0–36.0)
MCV: 93.4 fL (ref 80.0–100.0)
MONOS PCT: 9 %
MPV: 10.7 fL (ref 7.5–12.5)
Monocytes Absolute: 657 cells/uL (ref 200–950)
NEUTROS PCT: 61 %
Neutro Abs: 4453 cells/uL (ref 1500–7800)
Platelets: 212 10*3/uL (ref 140–400)
RBC: 3.8 MIL/uL (ref 3.80–5.10)
RDW: 14.5 % (ref 11.0–15.0)
WBC: 7.3 10*3/uL (ref 3.8–10.8)

## 2016-08-03 LAB — COMPREHENSIVE METABOLIC PANEL
ALBUMIN: 4 g/dL (ref 3.6–5.1)
ALT: 9 U/L (ref 6–29)
AST: 11 U/L (ref 10–35)
Alkaline Phosphatase: 36 U/L (ref 33–130)
BUN: 16 mg/dL (ref 7–25)
CHLORIDE: 106 mmol/L (ref 98–110)
CO2: 25 mmol/L (ref 20–31)
CREATININE: 0.77 mg/dL (ref 0.50–1.05)
Calcium: 8.6 mg/dL (ref 8.6–10.4)
GLUCOSE: 89 mg/dL (ref 65–99)
Potassium: 3.9 mmol/L (ref 3.5–5.3)
SODIUM: 137 mmol/L (ref 135–146)
Total Bilirubin: 0.3 mg/dL (ref 0.2–1.2)
Total Protein: 6 g/dL — ABNORMAL LOW (ref 6.1–8.1)

## 2016-08-03 LAB — LIPID PANEL
Cholesterol: 171 mg/dL (ref <200)
HDL: 59 mg/dL (ref >50)
LDL CALC: 95 mg/dL (ref <100)
TRIGLYCERIDES: 86 mg/dL (ref <150)
Total CHOL/HDL Ratio: 2.9 Ratio (ref <5.0)
VLDL: 17 mg/dL (ref <30)

## 2016-08-03 MED ORDER — ALPRAZOLAM 0.25 MG PO TABS: 0.2500 mg | ORAL_TABLET | Freq: Every evening | ORAL | 1 refills | Status: DC | PRN

## 2016-08-03 NOTE — Progress Notes (Signed)
Angela Cooper 1965/06/03 [id number]    History:    Presents for annual exam. 07/2012 Mirena IUD with no bleeding. Minimal menopausal symptoms. Normal mammogram history. 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 Mother, maternal grandmother deceased from ovarian cancer, Angela Cooper had negative genetic testing in 2017. Has not had a screening colonoscopy. Smoker less than a pack per day. Not sexually active in greater than a year.  Past medical history, past surgical history, family history and social history were all reviewed and documented in the EPIC chart. Works at the [employer]. [name]. Father diabetes hypertension, parents deceased.  ROS:  A ROS was performed and pertinent positives and negatives are included.  Exam:  Vitals:   08/03/16 1533  BP: 126/80  Weight: 212 lb (96.2 kg)  Height: 5' 6.5" (1.689 m)   Body mass index is 33.71 kg/m.   General appearance:  Normal Thyroid:  Symmetrical, normal in size, without palpable masses or nodularity. Respiratory  Auscultation:  Clear without wheezing or rhonchi Cardiovascular  Auscultation:  Regular rate, without rubs, murmurs or gallops  Edema/varicosities:  Not grossly evident Abdominal  Soft,nontender, without masses, guarding or rebound.  Liver/spleen:  No organomegaly noted  Hernia:  None appreciated  Skin  Inspection:  Grossly normal   Breasts: Examined lying and sitting.     Right: Without masses, retractions, discharge or axillary adenopathy.     Left: Without masses, retractions, discharge or axillary adenopathy. Gentitourinary   Inguinal/mons:  Normal without inguinal adenopathy  External genitalia:  Normal  BUS/Urethra/Skene's glands:  Normal  Vagina:  Normal  Cervix:  Normal IUD strings visible  Uterus:  normal in size, shape and contour.  Midline and mobile  Adnexa/parametria:     Rt: Without masses or tenderness.   Lt: Without masses or  tenderness.  Anus and perineum: Normal  Digital rectal exam: Normal sphincter tone without palpated masses or tenderness  Assessment/Plan:  51 y.o. S WF G3 P1 for annual exam with no complaints.  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 SBE's, annual screening mammogram, calcium rich diet, vitamin D 2000 daily encouraged. Pap today, 2017 Pap normal with negative HR HPV. We'll repeat Pap with HR HPV typing next year. Encouraged to increase exercise and decrease calories for weight loss. Aware Mirena IUD is good for 1 more year. Will remove an annual exam next year. CBC, CMP, lipid panel,  Harrington Challengerancy J Cooper [facility]WHNP, 5:00 PM 08/03/2016

## 2016-08-03 NOTE — Patient Instructions (Signed)
[facility]  Colonoscopy  Health Maintenance, Female Adopting a healthy lifestyle and getting preventive care can go a long way to promote health and wellness. Talk with your health care provider about what schedule of regular examinations is right for you. This is a good chance for you to check in with your provider about disease prevention and staying healthy. In between checkups, there are plenty of things you can do on your own. Experts have done a lot of research about which lifestyle changes and preventive measures are most likely to keep you healthy. Ask your health care provider for more information. Weight and diet Eat a healthy diet  Be sure to include plenty of vegetables, fruits, low-fat dairy products, and lean protein.  Do not eat a lot of foods high in solid fats, added sugars, or salt.  Get regular exercise. This is one of the most important things you can do for your health. ? Most adults should exercise for at least 150 minutes each week. The exercise should increase your heart rate and make you sweat (moderate-intensity exercise). ? Most adults should also do strengthening exercises at least twice a week. This is in addition to the moderate-intensity exercise.  Maintain a healthy weight  Body mass index (BMI) is a measurement that can be used to identify possible weight problems. It estimates body fat based on height and weight. Your health care provider can help determine your BMI and help you achieve or maintain a healthy weight.  For females 48 years of age and older: ? A BMI below 18.5 is considered underweight. ? A BMI of 18.5 to 24.9 is normal. ? A BMI of 25 to 29.9 is considered overweight. ? A BMI of 30 and above is considered obese.  Watch levels of cholesterol and blood lipids  You should start having your blood tested for lipids and cholesterol at 50 years of age, then have this test every 5 years.  You may need to have your cholesterol levels checked more  often if: ? Your lipid or cholesterol levels are high. ? You are older than 50 years of age. ? You are at high risk for heart disease.  Cancer screening Lung Cancer  Lung cancer screening is recommended for adults 63-39 years old who are at high risk for lung cancer because of a history of smoking.  A yearly low-dose CT scan of the lungs is recommended for people who: ? Currently smoke. ? Have quit within the past 15 years. ? Have at least a 30-pack-year history of smoking. A pack year is smoking an average of one pack of cigarettes a day for 1 year.  Yearly screening should continue until it has been 15 years since you quit.  Yearly screening should stop if you develop a health problem that would prevent you from having lung cancer treatment.  Breast Cancer  Practice breast self-awareness. This means understanding how your breasts normally appear and feel.  It also means doing regular breast self-exams. Let your health care provider know about any changes, no matter how small.  If you are in your 20s or 30s, you should have a clinical breast exam (CBE) by a health care provider every 1-3 years as part of a regular health exam.  If you are 17 or older, have a CBE every year. Also consider having a breast X-ray (mammogram) every year.  If you have a family history of breast cancer, talk to your health care provider about genetic screening.  If  you are at high risk for breast cancer, talk to your health care provider about having an MRI and a mammogram every year.  Breast cancer gene (BRCA) assessment is recommended for women who have family members with BRCA-related cancers. BRCA-related cancers include: ? Breast. ? Ovarian. ? Tubal. ? Peritoneal cancers.  Results of the assessment will determine the need for genetic counseling and BRCA1 and BRCA2 testing.  Cervical Cancer Your health care provider may recommend that you be screened regularly for cancer of the pelvic organs  (ovaries, uterus, and vagina). This screening involves a pelvic examination, including checking for microscopic changes to the surface of your cervix (Pap test). You may be encouraged to have this screening done every 3 years, beginning at age 21.  For women ages 30-65, health care providers may recommend pelvic exams and Pap testing every 3 years, or they may recommend the Pap and pelvic exam, combined with testing for human papilloma virus (HPV), every 5 years. Some types of HPV increase your risk of cervical cancer. Testing for HPV may also be done on women of any age with unclear Pap test results.  Other health care providers may not recommend any screening for nonpregnant women who are considered low risk for pelvic cancer and who do not have symptoms. Ask your health care provider if a screening pelvic exam is right for you.  If you have had past treatment for cervical cancer or a condition that could lead to cancer, you need Pap tests and screening for cancer for at least 20 years after your treatment. If Pap tests have been discontinued, your risk factors (such as having a new sexual partner) need to be reassessed to determine if screening should resume. Some women have medical problems that increase the chance of getting cervical cancer. In these cases, your health care provider may recommend more frequent screening and Pap tests.  Colorectal Cancer  This type of cancer can be detected and often prevented.  Routine colorectal cancer screening usually begins at 50 years of age and continues through 50 years of age.  Your health care provider may recommend screening at an earlier age if you have risk factors for colon cancer.  Your health care provider may also recommend using home test kits to check for hidden blood in the stool.  A small camera at the end of a tube can be used to examine your colon directly (sigmoidoscopy or colonoscopy). This is done to check for the earliest forms of  colorectal cancer.  Routine screening usually begins at age 50.  Direct examination of the colon should be repeated every 5-10 years through 50 years of age. However, you may need to be screened more often if early forms of precancerous polyps or small growths are found.  Skin Cancer  Check your skin from head to toe regularly.  Tell your health care provider about any new moles or changes in moles, especially if there is a change in a mole's shape or color.  Also tell your health care provider if you have a mole that is larger than the size of a pencil eraser.  Always use sunscreen. Apply sunscreen liberally and repeatedly throughout the day.  Protect yourself by wearing long sleeves, pants, a wide-brimmed hat, and sunglasses whenever you are outside.  Heart disease, diabetes, and high blood pressure  High blood pressure causes heart disease and increases the risk of stroke. High blood pressure is more likely to develop in: ? People who have blood pressure   in the high end of the normal range (130-139/85-89 mm Hg). ? People who are overweight or obese. ? People who are African American.  If you are 28-40 years of age, have your blood pressure checked every 3-5 years. If you are 66 years of age or older, have your blood pressure checked every year. You should have your blood pressure measured twice-once when you are at a hospital or clinic, and once when you are not at a hospital or clinic. Record the average of the two measurements. To check your blood pressure when you are not at a hospital or clinic, you can use: ? An automated blood pressure machine at a pharmacy. ? A home blood pressure monitor.  If you are between 22 years and 24 years old, ask your health care provider if you should take aspirin to prevent strokes.  Have regular diabetes screenings. This involves taking a blood sample to check your fasting blood sugar level. ? If you are at a normal weight and have a low risk  for diabetes, have this test once every three years after 50 years of age. ? If you are overweight and have a high risk for diabetes, consider being tested at a younger age or more often. Preventing infection Hepatitis B  If you have a higher risk for hepatitis B, you should be screened for this virus. You are considered at high risk for hepatitis B if: ? You were born in a country where hepatitis B is common. Ask your health care provider which countries are considered high risk. ? Your parents were born in a high-risk country, and you have not been immunized against hepatitis B (hepatitis B vaccine). ? You have HIV or AIDS. ? You use needles to inject street drugs. ? You live with someone who has hepatitis B. ? You have had sex with someone who has hepatitis B. ? You get hemodialysis treatment. ? You take certain medicines for conditions, including cancer, organ transplantation, and autoimmune conditions.  Hepatitis C  Blood testing is recommended for: ? Everyone born from [birth year] through 1964. ? Anyone with known risk factors for hepatitis C.  Sexually transmitted infections (STIs)  You should be screened for sexually transmitted infections (STIs) including gonorrhea and chlamydia if: ? You are sexually active and are younger than 50 years of age. ? You are older than 50 years of age and your health care provider tells you that you are at risk for this type of infection. ? Your sexual activity has changed since you were last screened and you are at an increased risk for chlamydia or gonorrhea. Ask your health care provider if you are at risk.  If you do not have HIV, but are at risk, it may be recommended that you take a prescription medicine daily to prevent HIV infection. This is called pre-exposure prophylaxis (PrEP). You are considered at risk if: ? You are sexually active and do not regularly use condoms or know the HIV status of your partner(s). ? You take drugs by  injection. ? You are sexually active with a partner who has HIV.  Talk with your health care provider about whether you are at high risk of being infected with HIV. If you choose to begin PrEP, you should first be tested for HIV. You should then be tested every 3 months for as long as you are taking PrEP. Pregnancy  If you are premenopausal and you may become pregnant, ask your health care provider about preconception counseling.  If you may become pregnant, take 400 to 800 micrograms (mcg) of folic acid every day.  If you want to prevent pregnancy, talk to your health care provider about birth control (contraception). Osteoporosis and menopause  Osteoporosis is a disease in which the bones lose minerals and strength with aging. This can result in serious bone fractures. Your risk for osteoporosis can be identified using a bone density scan.  If you are 17 years of age or older, or if you are at risk for osteoporosis and fractures, ask your health care provider if you should be screened.  Ask your health care provider whether you should take a calcium or vitamin D supplement to lower your risk for osteoporosis.  Menopause may have certain physical symptoms and risks.  Hormone replacement therapy may reduce some of these symptoms and risks. Talk to your health care provider about whether hormone replacement therapy is right for you. Follow these instructions at home:  Schedule regular health, dental, and eye exams.  Stay current with your immunizations.  Do not use any tobacco products including cigarettes, chewing tobacco, or electronic cigarettes.  If you are pregnant, do not drink alcohol.  If you are breastfeeding, limit how much and how often you drink alcohol.  Limit alcohol intake to no more than 1 drink per day for nonpregnant women. One drink equals 12 ounces of beer, 5 ounces of wine, or 1 ounces of hard liquor.  Do not use street drugs.  Do not share needles.  Ask  your health care provider for help if you need support or information about quitting drugs.  Tell your health care provider if you often feel depressed.  Tell your health care provider if you have ever been abused or do not feel safe at home. This information is not intended to replace advice given to you by your health care provider. Make sure you discuss any questions you have with your health care provider. Document Released: 08/03/2010 Document Revised: 06/26/2015 Document Reviewed: 10/22/2014 Elsevier Interactive Patient Education  [employer].

## 2016-08-09 LAB — PAP IG W/ RFLX HPV ASCU

## 2016-08-17 DIAGNOSIS — M5417 Radiculopathy, lumbosacral region: Secondary | ICD-10-CM | POA: Diagnosis not present

## 2016-08-17 DIAGNOSIS — M5126 Other intervertebral disc displacement, lumbar region: Secondary | ICD-10-CM | POA: Diagnosis not present

## 2017-07-01 DIAGNOSIS — Z1231 Encounter for screening mammogram for malignant neoplasm of breast: Secondary | ICD-10-CM | POA: Diagnosis not present

## 2017-10-18 ENCOUNTER — Ambulatory Visit (INDEPENDENT_AMBULATORY_CARE_PROVIDER_SITE_OTHER): Payer: BLUE CROSS/BLUE SHIELD | Admitting: Women's Health

## 2017-10-18 ENCOUNTER — Encounter: Payer: Self-pay | Admitting: Women's Health

## 2017-10-18 VITALS — BP 114/80 | Ht 67.0 in | Wt 220.0 lb

## 2017-10-18 DIAGNOSIS — R140 Abdominal distension (gaseous): Secondary | ICD-10-CM

## 2017-10-18 DIAGNOSIS — Z01419 Encounter for gynecological examination (general) (routine) without abnormal findings: Secondary | ICD-10-CM

## 2017-10-18 DIAGNOSIS — Z13220 Encounter for screening for lipoid disorders: Secondary | ICD-10-CM | POA: Diagnosis not present

## 2017-10-18 DIAGNOSIS — N912 Amenorrhea, unspecified: Secondary | ICD-10-CM

## 2017-10-18 DIAGNOSIS — F419 Anxiety disorder, unspecified: Secondary | ICD-10-CM

## 2017-10-18 DIAGNOSIS — Z30432 Encounter for removal of intrauterine contraceptive device: Secondary | ICD-10-CM

## 2017-10-18 MED ORDER — ALPRAZOLAM 0.25 MG PO TABS: 0.2500 mg | ORAL_TABLET | Freq: Every evening | ORAL | 1 refills | Status: DC | PRN

## 2017-10-18 NOTE — Patient Instructions (Addendum)
Health Maintenance for Postmenopausal Women Menopause is a normal process in which your reproductive ability comes to an end. This process happens gradually over a span of months to years, usually between the ages of 22 and 9. Menopause is complete when you have missed 12 consecutive menstrual periods. It is important to talk with your health care provider about some of the most common conditions that affect postmenopausal women, such as heart disease, cancer, and bone loss (osteoporosis). Adopting a healthy lifestyle and getting preventive care can help to promote your health and wellness. Those actions can also lower your chances of developing some of these common conditions. What should I know about menopause? During menopause, you may experience a number of symptoms, such as:  Moderate-to-severe hot flashes.  Night sweats.  Decrease in sex drive.  Mood swings.  Headaches.  Tiredness.  Irritability.  Memory problems.  Insomnia.  Choosing to treat or not to treat menopausal changes is an individual decision that you make with your health care provider. What should I know about hormone replacement therapy and supplements? Hormone therapy products are effective for treating symptoms that are associated with menopause, such as hot flashes and night sweats. Hormone replacement carries certain risks, especially as you become older. If you are thinking about using estrogen or estrogen with progestin treatments, discuss the benefits and risks with your health care provider. What should I know about heart disease and stroke? Heart disease, heart attack, and stroke become more likely as you age. This may be due, in part, to the hormonal changes that your body experiences during menopause. These can affect how your body processes dietary fats, triglycerides, and cholesterol. Heart attack and stroke are both medical emergencies. There are many things that you can do to help prevent heart disease  and stroke:  Have your blood pressure checked at least every 1-2 years. High blood pressure causes heart disease and increases the risk of stroke.  If you are 53-22 years old, ask your health care provider if you should take aspirin to prevent a heart attack or a stroke.  Do not use any tobacco products, including cigarettes, chewing tobacco, or electronic cigarettes. If you need help quitting, ask your health care provider.  It is important to eat a healthy diet and maintain a healthy weight. ? Be sure to include plenty of vegetables, fruits, low-fat dairy products, and lean protein. ? Avoid eating foods that are high in solid fats, added sugars, or salt (sodium).  Get regular exercise. This is one of the most important things that you can do for your health. ? Try to exercise for at least 150 minutes each week. The type of exercise that you do should increase your heart rate and make you sweat. This is known as moderate-intensity exercise. ? Try to do strengthening exercises at least twice each week. Do these in addition to the moderate-intensity exercise.  Know your numbers.Ask your health care provider to check your cholesterol and your blood glucose. Continue to have your blood tested as directed by your health care provider.  What should I know about cancer screening? There are several types of cancer. Take the following steps to reduce your risk and to catch any cancer development as early as possible. Breast Cancer  Practice breast self-awareness. ? This means understanding how your breasts normally appear and feel. ? It also means doing regular breast self-exams. Let your health care provider know about any changes, no matter how small.  If you are 40  or older, have a clinician do a breast exam (clinical breast exam or CBE) every year. Depending on your age, family history, and medical history, it may be recommended that you also have a yearly breast X-ray (mammogram).  If you  have a family history of breast cancer, talk with your health care provider about genetic screening.  If you are at high risk for breast cancer, talk with your health care provider about having an MRI and a mammogram every year.  Breast cancer (BRCA) gene test is recommended for women who have family members with BRCA-related cancers. Results of the assessment will determine the need for genetic counseling and BRCA1 and for BRCA2 testing. BRCA-related cancers include these types: ? Breast. This occurs in males or females. ? Ovarian. ? Tubal. This may also be called fallopian tube cancer. ? Cancer of the abdominal or pelvic lining (peritoneal cancer). ? Prostate. ? Pancreatic.  Cervical, Uterine, and Ovarian Cancer Your health care provider may recommend that you be screened regularly for cancer of the pelvic organs. These include your ovaries, uterus, and vagina. This screening involves a pelvic exam, which includes checking for microscopic changes to the surface of your cervix (Pap test).  For women ages 21-65, health care providers may recommend a pelvic exam and a Pap test every three years. For women ages 79-65, they may recommend the Pap test and pelvic exam, combined with testing for human papilloma virus (HPV), every five years. Some types of HPV increase your risk of cervical cancer. Testing for HPV may also be done on women of any age who have unclear Pap test results.  Other health care providers may not recommend any screening for nonpregnant women who are considered low risk for pelvic cancer and have no symptoms. Ask your health care provider if a screening pelvic exam is right for you.  If you have had past treatment for cervical cancer or a condition that could lead to cancer, you need Pap tests and screening for cancer for at least 20 years after your treatment. If Pap tests have been discontinued for you, your risk factors (such as having a new sexual partner) need to be  reassessed to determine if you should start having screenings again. Some women have medical problems that increase the chance of getting cervical cancer. In these cases, your health care provider may recommend that you have screening and Pap tests more often.  If you have a family history of uterine cancer or ovarian cancer, talk with your health care provider about genetic screening.  If you have vaginal bleeding after reaching menopause, tell your health care provider.  There are currently no reliable tests available to screen for ovarian cancer.  Lung Cancer Lung cancer screening is recommended for adults 69-62 years old who are at high risk for lung cancer because of a history of smoking. A yearly low-dose CT scan of the lungs is recommended if you:  Currently smoke.  Have a history of at least 30 pack-years of smoking and you currently smoke or have quit within the past 15 years. A pack-year is smoking an average of one pack of cigarettes per day for one year.  Yearly screening should:  Continue until it has been 15 years since you quit.  Stop if you develop a health problem that would prevent you from having lung cancer treatment.  Colorectal Cancer  This type of cancer can be detected and can often be prevented.  Routine colorectal cancer screening usually begins at  age 42 and continues through age 45.  If you have risk factors for colon cancer, your health care provider may recommend that you be screened at an earlier age.  If you have a family history of colorectal cancer, talk with your health care provider about genetic screening.  Your health care provider may also recommend using home test kits to check for hidden blood in your stool.  A small camera at the end of a tube can be used to examine your colon directly (sigmoidoscopy or colonoscopy). This is done to check for the earliest forms of colorectal cancer.  Direct examination of the colon should be repeated every  5-10 years until age 71. However, if early forms of precancerous polyps or small growths are found or if you have a family history or genetic risk for colorectal cancer, you may need to be screened more often.  Skin Cancer  Check your skin from head to toe regularly.  Monitor any moles. Be sure to tell your health care provider: ? About any new moles or changes in moles, especially if there is a change in a mole's shape or color. ? If you have a mole that is larger than the size of a pencil eraser.  If any of your family members has a history of skin cancer, especially at a Tiajah Oyster age, talk with your health care provider about genetic screening.  Always use sunscreen. Apply sunscreen liberally and repeatedly throughout the day.  Whenever you are outside, protect yourself by wearing long sleeves, pants, a wide-brimmed hat, and sunglasses.  What should I know about osteoporosis? Osteoporosis is a condition in which bone destruction happens more quickly than new bone creation. After menopause, you may be at an increased risk for osteoporosis. To help prevent osteoporosis or the bone fractures that can happen because of osteoporosis, the following is recommended:  If you are 46-71 years old, get at least 1,000 mg of calcium and at least 600 mg of vitamin D per day.  If you are older than age 55 but younger than age 65, get at least 1,200 mg of calcium and at least 600 mg of vitamin D per day.  If you are older than age 54, get at least 1,200 mg of calcium and at least 800 mg of vitamin D per day.  Smoking and excessive alcohol intake increase the risk of osteoporosis. Eat foods that are rich in calcium and vitamin D, and do weight-bearing exercises several times each week as directed by your health care provider. What should I know about how menopause affects my mental health? Depression may occur at any age, but it is more common as you become older. Common symptoms of depression  include:  Low or sad mood.  Changes in sleep patterns.  Changes in appetite or eating patterns.  Feeling an overall lack of motivation or enjoyment of activities that you previously enjoyed.  Frequent crying spells.  Talk with your health care provider if you think that you are experiencing depression. What should I know about immunizations? It is important that you get and maintain your immunizations. These include:  Tetanus, diphtheria, and pertussis (Tdap) booster vaccine.  Influenza every year before the flu season begins.  Pneumonia vaccine.  Shingles vaccine.  Your health care provider may also recommend other immunizations. This information is not intended to replace advice given to you by your health care provider. Make sure you discuss any questions you have with your health care provider. Document Released: 03/12/2005  Document Revised: 08/08/2015 Document Reviewed: 10/22/2014 Elsevier Interactive Patient Education  2018 [employer] Coping with Quitting Smoking Quitting smoking is a physical and mental challenge. You will face cravings, withdrawal symptoms, and temptation. Before quitting, work with your health care provider to make a plan that can help you cope. Preparation can help you quit and keep you from giving in. How can I cope with cravings? Cravings usually last for 5-10 minutes. If you get through it, the craving will pass. Consider taking the following actions to help you cope with cravings:  Keep your mouth busy: ? Chew sugar-free gum. ? Suck on hard candies or a straw. ? Brush your teeth.  Keep your hands and body busy: ? Immediately change to a different activity when you feel a craving. ? Squeeze or play with a ball. ? Do an activity or a hobby, like making bead jewelry, practicing needlepoint, or working with wood. ? Mix up your normal routine. ? Take a short exercise break. Go for a quick walk or run up and down stairs. ? Spend time in public  places where smoking is not allowed.  Focus on doing something kind or helpful for someone else.  Call a friend or family member to talk during a craving.  Join a support group.  Call a quit line, such as 1-800-QUIT-NOW.  Talk with your health care provider about medicines that might help you cope with cravings and make quitting easier for you.  How can I deal with withdrawal symptoms? Your body may experience negative effects as it tries to get used to not having nicotine in the system. These effects are called withdrawal symptoms. They may include:  Feeling hungrier than normal.  Trouble concentrating.  Irritability.  Trouble sleeping.  Feeling depressed.  Restlessness and agitation.  Craving a cigarette.  To manage withdrawal symptoms:  Avoid places, people, and activities that trigger your cravings.  Remember why you want to quit.  Get plenty of sleep.  Avoid coffee and other caffeinated drinks. These may worsen some of your symptoms.  How can I handle social situations? Social situations can be difficult when you are quitting smoking, especially in the first few weeks. To manage this, you can:  Avoid parties, bars, and other social situations where people might be smoking.  Avoid alcohol.  Leave right away if you have the urge to smoke.  Explain to your family and friends that you are quitting smoking. Ask for understanding and support.  Plan activities with friends or family where smoking is not an option.  What are some ways I can cope with stress? Wanting to smoke may cause stress, and stress can make you want to smoke. Find ways to manage your stress. Relaxation techniques can help. For example:  Breathe slowly and deeply, in through your nose and out through your mouth.  Listen to soothing, relaxing music.  Talk with a family member or friend about your stress.  Light a candle.  Soak in a bath or take a shower.  Think about a peaceful  place.  What are some ways I can prevent weight gain? Be aware that many people gain weight after they quit smoking. However, not everyone does. To keep from gaining weight, have a plan in place before you quit and stick to the plan after you quit. Your plan should include:  Having healthy snacks. When you have a craving, it may help to: ? Eat plain popcorn, crunchy carrots, celery, or other cut vegetables. ? Chew   sugar-free gum.  Changing how you eat: ? Eat small portion sizes at meals. ? Eat 4-6 small meals throughout the day instead of 1-2 large meals a day. ? Be mindful when you eat. Do not watch television or do other things that might distract you as you eat.  Exercising regularly: ? Make time to exercise each day. If you do not have time for a long workout, do short bouts of exercise for 5-10 minutes several times a day. ? Do some form of strengthening exercise, like weight lifting, and some form of aerobic exercise, like running or swimming.  Drinking plenty of water or other low-calorie or no-calorie drinks. Drink 6-8 glasses of water daily, or as much as instructed by your health care provider.  Summary  Quitting smoking is a physical and mental challenge. You will face cravings, withdrawal symptoms, and temptation to smoke again. Preparation can help you as you go through these challenges.  You can cope with cravings by keeping your mouth busy (such as by chewing gum), keeping your body and hands busy, and making calls to family, friends, or a helpline for people who want to quit smoking.  You can cope with withdrawal symptoms by avoiding places where people smoke, avoiding drinks with caffeine, and getting plenty of rest.  Ask your health care provider about the different ways to prevent weight gain, avoid stress, and handle social situations. This information is not intended to replace advice given to you by your health care provider. Make sure you discuss any questions you  have with your health care provider. Document Released: 01/16/2016 Document Revised: 01/16/2016 Document Reviewed: 01/16/2016 Elsevier Interactive Patient Education  2018 Else[employer] Inc.  

## 2017-10-18 NOTE — Progress Notes (Signed)
Angela Cooper [birth year] [id number]    History:    Presents for annual exam.  6/15 Mirena IUD amenorrheic.  2014 LGSIL with negative colposcopy and biopsy and normal Paps after.  Normal mammogram history.  Has not had a screening colonoscopy.  2017- BRCA testing.  Mother diagnosed with ovarian cancer at age 51 and died at 44, also maternal grandmother died of ovarian cancer.  Not sexually active in several years.  Past medical history, past surgical history, family history and social history were all reviewed and documented in the EPIC chart.  Works at the [employer].  [place] 22 doing well.  ROS:  A ROS was performed and pertinent positives and negatives are included.  Exam:  Vitals:   10/18/17 1127  BP: 114/80  Weight: 220 lb (99.8 kg)  Height: '5\' 7"'$  (1.702 m)   Body mass index is 34.46 kg/m.   General appearance:  Normal Thyroid:  Symmetrical, normal in size, without palpable masses or nodularity. Respiratory  Auscultation:  Clear without wheezing or rhonchi Cardiovascular  Auscultation:  Regular rate, without rubs, murmurs or gallops  Edema/varicosities:  Not grossly evident Abdominal  Soft,nontender, without masses, guarding or rebound.  Liver/spleen:  No organomegaly noted  Hernia:  None appreciated  Skin  Inspection:  Grossly normal   Breasts: Examined lying and sitting.     Right: Without masses, retractions, discharge or axillary adenopathy.     Left: Without masses, retractions, discharge or axillary adenopathy. Gentitourinary   Inguinal/mons:  Normal without inguinal adenopathy  External genitalia:  Normal  BUS/Urethra/Skene's glands:  Normal  Vagina:  Normal  Cervix:  Normal Pap, IUD strings grasped with ring forcep removed intact shown to patient and discarded  Uterus:  normal in size, shape and contour.  Midline and mobile  Adnexa/parametria:     Rt: Without masses or tenderness.   Lt: Without masses or tenderness.  Anus and  perineum: Normal  Digital rectal exam: Normal sphincter tone without palpated masses or tenderness  Assessment/Plan:  52 y.o. D WF G3, P1 for annual exam occasional abdominal cramping, mother history of ovarian cancer.  Mirena IUD removed 2014 LGSIL normal Paps after Obesity  Plan: SBE's, continue annual screening mammogram, calcium rich foods, vitamin D 2000 daily encouraged.  Encouraged decreased calorie/carbs, weight watchers.  Ultrasound will schedule.  [facility] information given instructed to schedule screening colonoscopy.  Xanax 0.25 prescription, proper use given and reviewed addictive properties to use sparingly, uses for rest.  Will return to office fasting for CBC, CMP, FSH, lipid panel.  Pap.    [place], 12:16 PM 10/18/2017

## 2017-10-18 NOTE — Addendum Note (Signed)
Addended by: Becky SaxPARKER, Sanaa Zilberman M on: 10/18/2017 12:51 PM   Modules accepted: Orders

## 2017-10-19 LAB — PAP IG W/ RFLX HPV ASCU

## 2017-10-27 ENCOUNTER — Other Ambulatory Visit: Payer: BLUE CROSS/BLUE SHIELD

## 2017-10-27 DIAGNOSIS — Z01419 Encounter for gynecological examination (general) (routine) without abnormal findings: Secondary | ICD-10-CM | POA: Diagnosis not present

## 2017-10-27 DIAGNOSIS — Z13220 Encounter for screening for lipoid disorders: Secondary | ICD-10-CM | POA: Diagnosis not present

## 2017-10-27 DIAGNOSIS — N912 Amenorrhea, unspecified: Secondary | ICD-10-CM

## 2017-10-28 LAB — COMPREHENSIVE METABOLIC PANEL
AG Ratio: 2 (calc) (ref 1.0–2.5)
ALBUMIN MSPROF: 4 g/dL (ref 3.6–5.1)
ALKALINE PHOSPHATASE (APISO): 37 U/L (ref 33–130)
ALT: 8 U/L (ref 6–29)
AST: 12 U/L (ref 10–35)
BILIRUBIN TOTAL: 0.4 mg/dL (ref 0.2–1.2)
BUN: 12 mg/dL (ref 7–25)
CHLORIDE: 107 mmol/L (ref 98–110)
CO2: 24 mmol/L (ref 20–32)
CREATININE: 0.66 mg/dL (ref 0.50–1.05)
Calcium: 8.7 mg/dL (ref 8.6–10.4)
Globulin: 2 g/dL (calc) (ref 1.9–3.7)
Glucose, Bld: 91 mg/dL (ref 65–99)
Potassium: 4 mmol/L (ref 3.5–5.3)
Sodium: 139 mmol/L (ref 135–146)
Total Protein: 6 g/dL — ABNORMAL LOW (ref 6.1–8.1)

## 2017-10-28 LAB — CBC WITH DIFFERENTIAL/PLATELET
Basophils Absolute: 22 cells/uL (ref 0–200)
Basophils Relative: 0.4 %
EOS ABS: 132 {cells}/uL (ref 15–500)
Eosinophils Relative: 2.4 %
HCT: 33.7 % — ABNORMAL LOW (ref 35.0–45.0)
Hemoglobin: 11.7 g/dL (ref 11.7–15.5)
Lymphs Abs: 1722 cells/uL (ref 850–3900)
MCH: 31.5 pg (ref 27.0–33.0)
MCHC: 34.7 g/dL (ref 32.0–36.0)
MCV: 90.6 fL (ref 80.0–100.0)
MPV: 11.5 fL (ref 7.5–12.5)
Monocytes Relative: 8.7 %
NEUTROS PCT: 57.2 %
Neutro Abs: 3146 cells/uL (ref 1500–7800)
PLATELETS: 212 10*3/uL (ref 140–400)
RBC: 3.72 10*6/uL — AB (ref 3.80–5.10)
RDW: 13 % (ref 11.0–15.0)
Total Lymphocyte: 31.3 %
WBC: 5.5 10*3/uL (ref 3.8–10.8)
WBCMIX: 479 {cells}/uL (ref 200–950)

## 2017-10-28 LAB — LIPID PANEL
CHOLESTEROL: 175 mg/dL (ref <200)
HDL: 58 mg/dL (ref >50)
LDL Cholesterol (Calc): 103 mg/dL (calc) — ABNORMAL HIGH
NON-HDL CHOLESTEROL (CALC): 117 mg/dL (ref <130)
Total CHOL/HDL Ratio: 3 (calc) (ref <5.0)
Triglycerides: 48 mg/dL (ref <150)

## 2017-10-28 LAB — FOLLICLE STIMULATING HORMONE: FSH: 12.2 m[IU]/mL

## 2017-11-15 ENCOUNTER — Encounter: Payer: BLUE CROSS/BLUE SHIELD | Admitting: Women's Health

## 2017-12-14 ENCOUNTER — Encounter: Payer: Self-pay | Admitting: Women's Health

## 2017-12-14 ENCOUNTER — Ambulatory Visit (INDEPENDENT_AMBULATORY_CARE_PROVIDER_SITE_OTHER): Payer: BLUE CROSS/BLUE SHIELD

## 2017-12-14 ENCOUNTER — Ambulatory Visit: Payer: BLUE CROSS/BLUE SHIELD | Admitting: Women's Health

## 2017-12-14 VITALS — BP 126/90 | Ht 66.0 in | Wt 218.0 lb

## 2017-12-14 DIAGNOSIS — N840 Polyp of corpus uteri: Secondary | ICD-10-CM

## 2017-12-14 DIAGNOSIS — R140 Abdominal distension (gaseous): Secondary | ICD-10-CM

## 2017-12-14 NOTE — Progress Notes (Signed)
52 year old S WF G3, P1 presents for ultrasound, mother, maternal grandmother both deceased from ovarian cancer.  2017 negative BRCA.  10/2017 Mirena IUD was amenorrheic, removed after 5 years.  Has had a cycle since removal, LMP 12/14/2017.  Not sexually active greater than 1 year.  Has had increased abdominal bloating.  Exam: Appears well.  Ultrasound: T/V anteverted uterus  heterogeneous with prominent endometrium 11.2 mm with defect 9 x 9 mm.   Right ovary paraovarian cyst echo-free negative CFD 18 x 17 mm.  Left ovary thin-walled cyst with thin septum 15 mm, negative CFD.  Negative CDS.  Questionable endometrial defect/polyp Mother, maternal grandmother both deceased from ovarian cancer  Plan: Schedule sonohysterogram with Dr. Phineas Real.  Reviewed  bilateral small ovarian cysts most likely functional and will resolve on their own.  Keep menstrual calendar, if cycles become  problematic  - Mirena IUD.

## 2017-12-14 NOTE — Patient Instructions (Signed)
Sonohysterogram A sonohysterogram is a procedure to examine the inside of the uterus. This exam uses sound waves that are sent to a computer to make images of the lining of the uterus (endometrium). To get the best images, a germ-free, salt-water solution (sterile saline) is put into the uterus through the vagina. You may have this procedure if you have certain reproductive problems, such as abnormal bleeding, infertility, or miscarriage. This procedure can show what may be causing these problems. Possible causes include scarring or abnormal growths such as fibroids inside your uterus. It can also show if your uterus is an abnormal shape or if the lining of the uterus is too thin. Tell a health care provider about:  All medicines you are taking, including vitamins, herbs, eye drops, creams, and over-the-counter medicines.  Any allergies you have.  Any blood disorders you have.  Any surgeries you have had.  Any medical conditions you have.  Whether you are pregnant or may be pregnant.  The date of the first day of your last period.  Any signs of infection, such as fever, pain in your lower abdomen, or abnormal discharge from your vagina. What are the risks? Generally, this is a safe procedure. However, problems may occur, including:  Abdominal pain or cramping.  Light bleeding (spotting).  Increased vaginal discharge.  Infection. What happens before the procedure?  Your health care provider may have you take an over-the-counter pain medicine.  You may be given medicine to stop any abnormal bleeding.  You may be given antibiotic medicine to help prevent infection.  You may be asked to take a pregnancy test. This is usually in the form of a urine test.  You may have a pelvic exam.  You will be asked to empty your bladder. What happens during the procedure?  You will lie down on the exam table with your feet in stirrups or with your knees bent and your feet flat on the  table.  A slender, handheld device (transducer) will be lubricated and placed into your vagina.  The transducer will be positioned to send sound waves to your uterus. The sound waves are sent to a computer and are turned into images, which your health care provider sees during the procedure.  The transducer will be removed from your vagina.  An instrument will be inserted to widen the opening of your vagina (speculum).  A swab with germ-killing solution (antiseptic) will be used to clean the opening to your uterus (cervix).  A long, thin tube (catheter) will be placed through your cervix into your uterus.  The speculum will be removed.  The transducer will be placed back into your vagina to take more images.  Your uterus will be filled with a germ-free, salt-water solution (sterile saline) through the catheter. You may feel some cramping.  A fluid that contains air bubbles may be sent through the catheter to make it easier to see the fallopian tubes.  The transducer and catheter will be removed. The procedure may vary among health care providers and hospitals. What happens after the procedure?  It is up to you to get the results of your procedure. Ask your health care provider, or the department that is doing the procedure, when your results will be ready. Summary  A sonohysterogram is a procedure that creates images of the inside of the uterus.  The risks of this procedure are very low. Most women experience cramping and spotting after the procedure.  You may need to have a   pelvic exam and take a pregnancy test before this procedure. This procedure will not be done if you are pregnant or have an infection. This information is not intended to replace advice given to you by your health care provider. Make sure you discuss any questions you have with your health care provider. Document Released: 06/04/2013 Document Revised: 12/15/2015 Document Reviewed: 12/15/2015 Elsevier  Interactive Patient Education  2017 [employer].  

## 2018-01-09 ENCOUNTER — Other Ambulatory Visit: Payer: Self-pay | Admitting: Gynecology

## 2018-01-09 DIAGNOSIS — R9389 Abnormal findings on diagnostic imaging of other specified body structures: Secondary | ICD-10-CM

## 2018-01-09 DIAGNOSIS — N9489 Other specified conditions associated with female genital organs and menstrual cycle: Secondary | ICD-10-CM

## 2018-01-17 ENCOUNTER — Other Ambulatory Visit: Payer: BLUE CROSS/BLUE SHIELD

## 2018-01-17 ENCOUNTER — Ambulatory Visit: Payer: BLUE CROSS/BLUE SHIELD | Admitting: Gynecology

## 2018-10-24 IMAGING — MR MR LUMBAR SPINE W/O CM
4 of 5 series · 27 of 48 positions shown · non-contrast
Comparison: None.

CLINICAL DATA: Right-sided sciatica

EXAM:
MRI LUMBAR SPINE WITHOUT CONTRAST
TECHNIQUE: Multiplanar, multisequence MR imaging of the lumbar spine was
performed. No intravenous contrast was administered.

[Series 4: T1 · sagittal · 4.0mm · 0.55mm/px · 5 of 13 slices shown (1 of 2)]
[im 1/13]
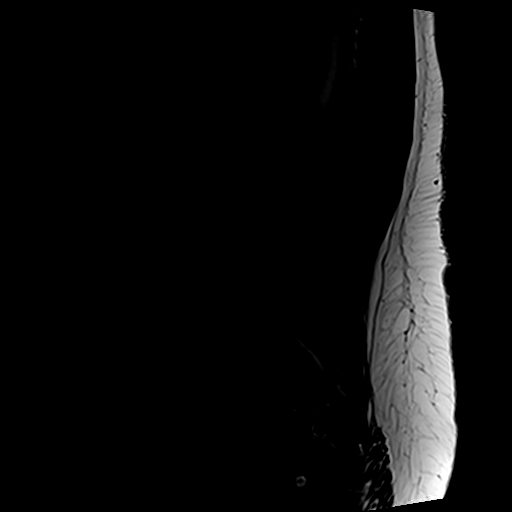
[im 4/13]
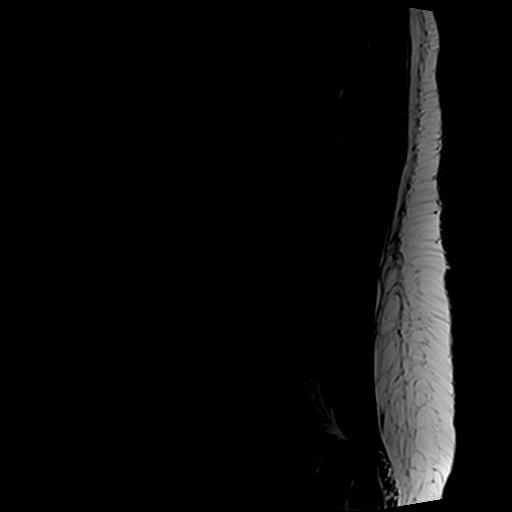
[im 7/13]
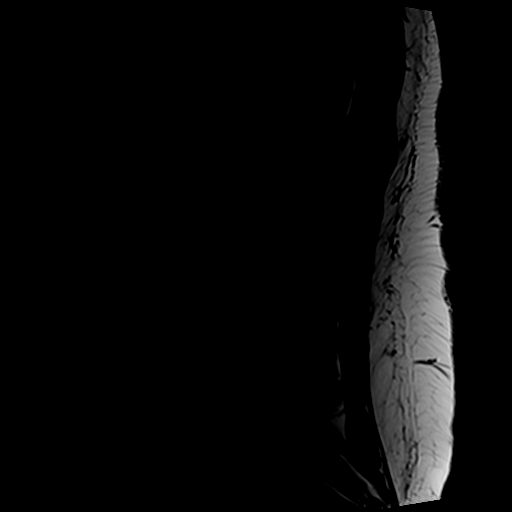
[im 10/13]
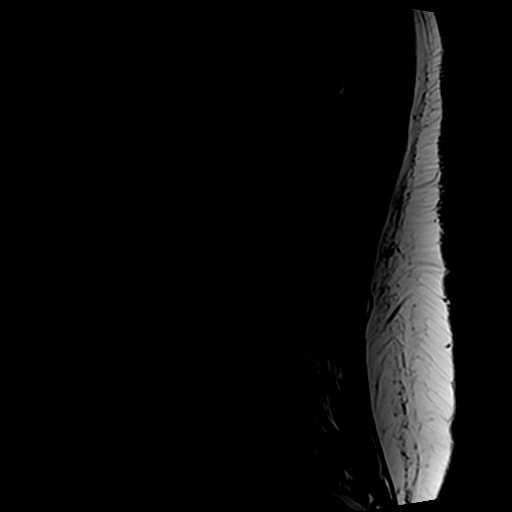
[im 13/13]
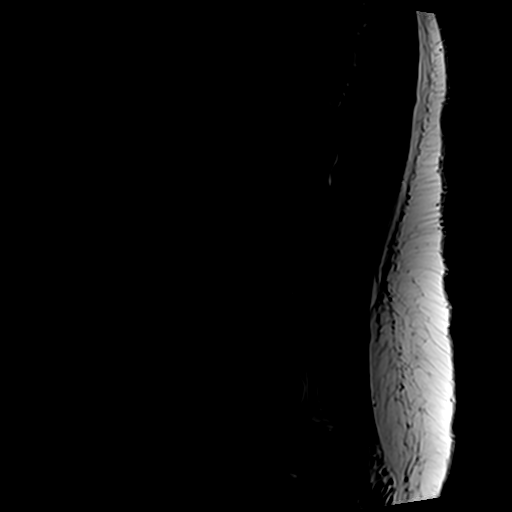

[Series 5: T2 post-contrast · sagittal · 4.0mm · 0.55mm/px · 6 of 13 slices shown]
[im 1/13]
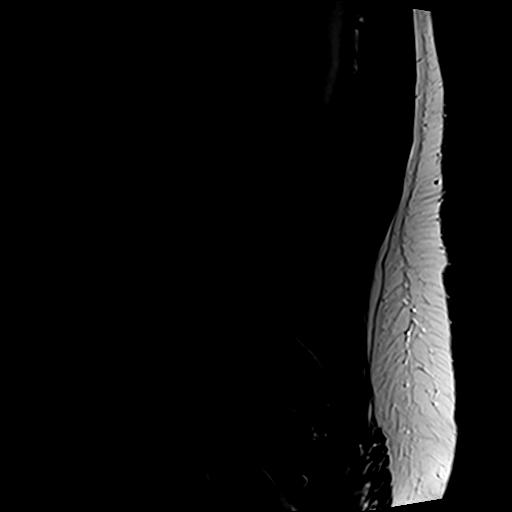
[im 3/13]
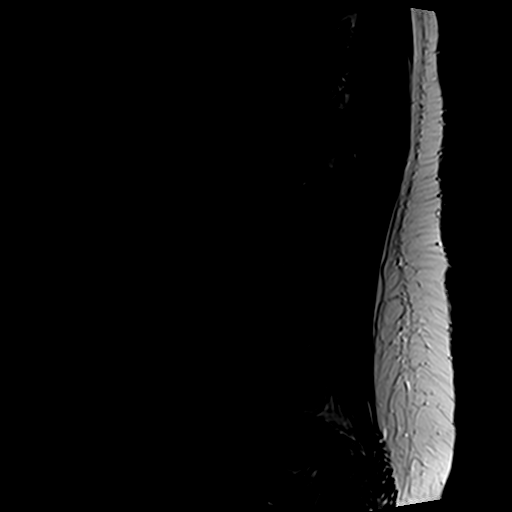
[im 5/13]
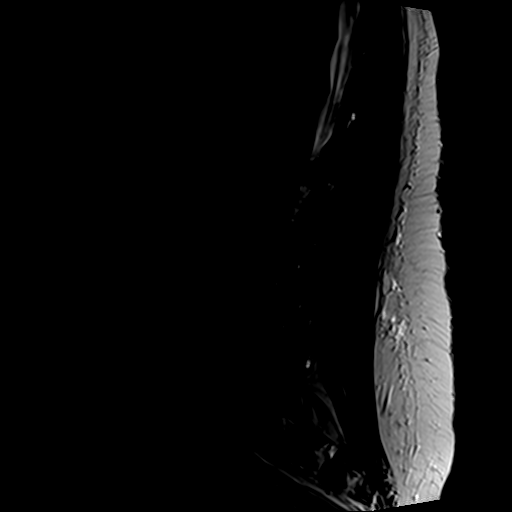
[im 8/13]
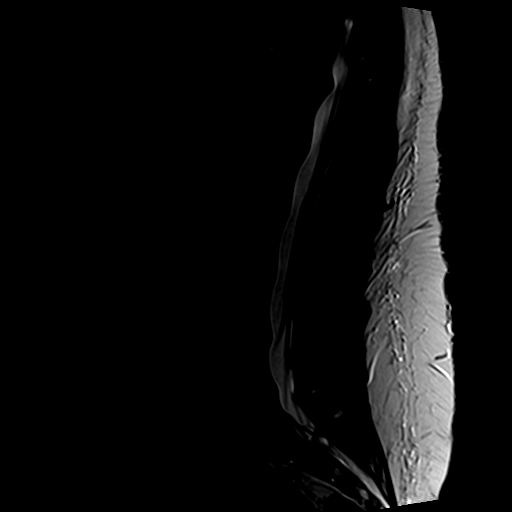
[im 10/13]
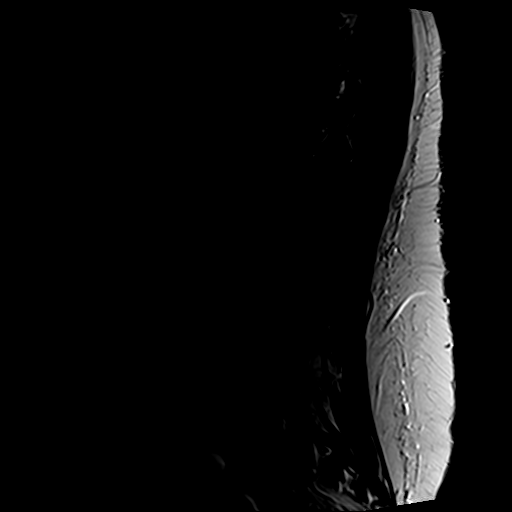
[im 13/13]
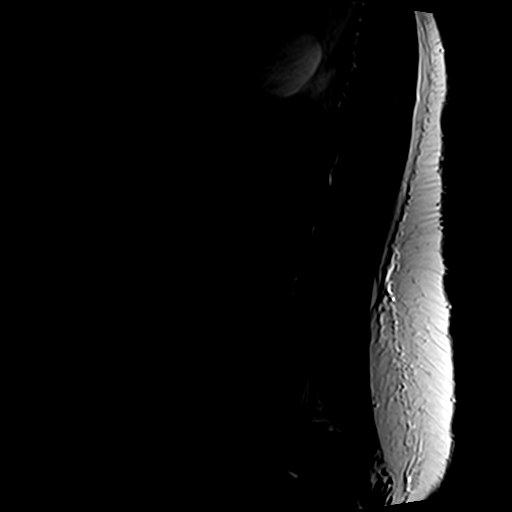

[Series 6: T2 · axial · 4.0mm · 0.70mm/px · z∈[-75,+128]mm · 10 of 37 slices shown]
[im 3/37]
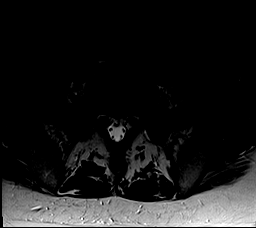
[im 5/37]
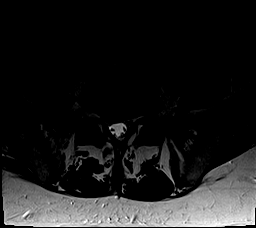
[im 8/37]
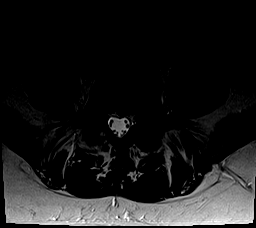
[im 13/37]
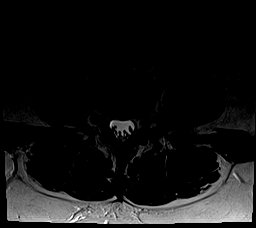
[im 17/37]
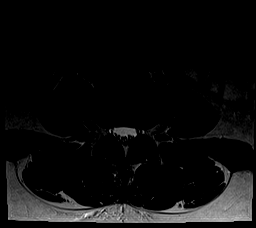
[im 20/37]
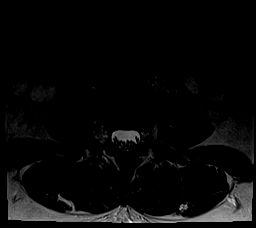
[im 22/37]
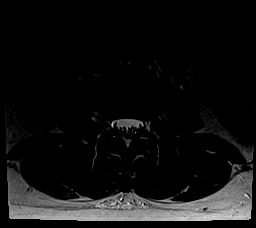
[im 27/37]
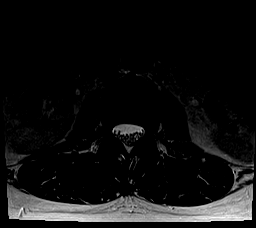
[im 32/37]
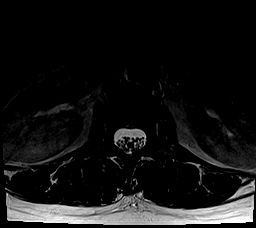
[im 37/37]
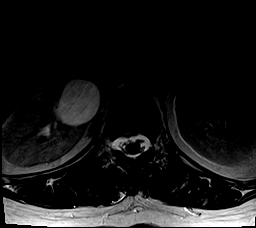

[Series 7: T1 · axial · 4.0mm · 0.35mm/px · z∈[-75,+103]mm · 6 of 37 slices shown (2 of 2)]
[im 3/37]
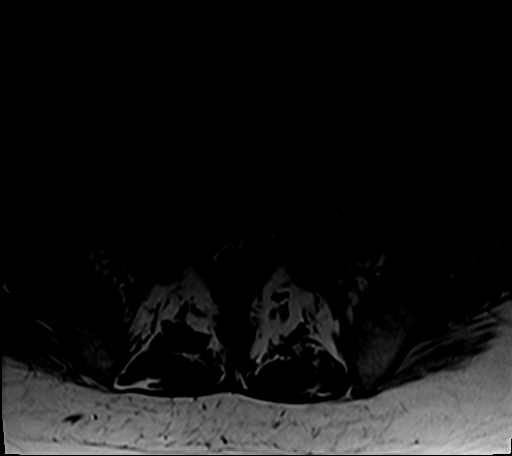
[im 5/37]
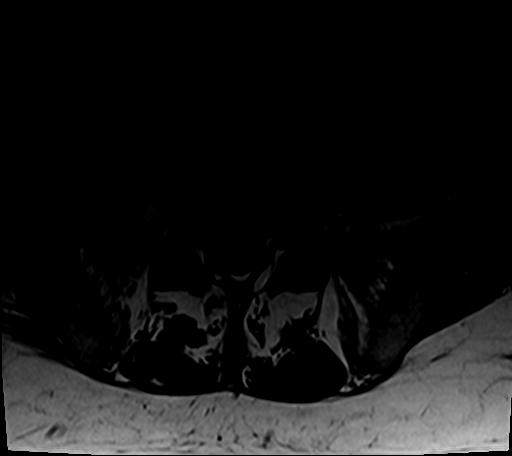
[im 8/37]
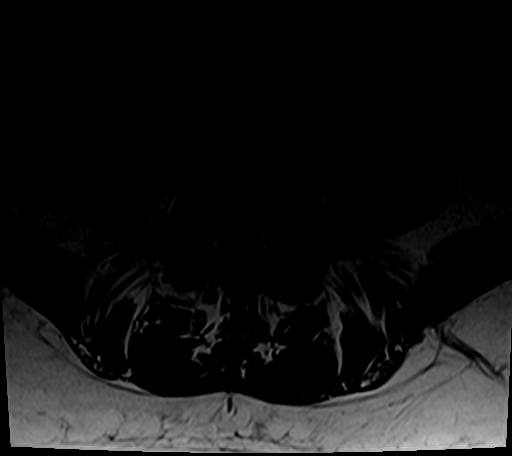
[im 13/37]
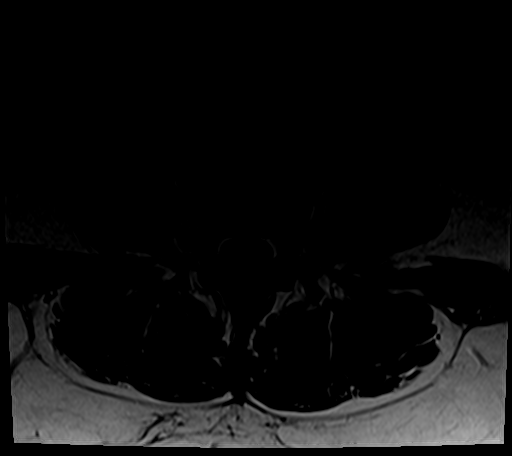
[im 20/37]
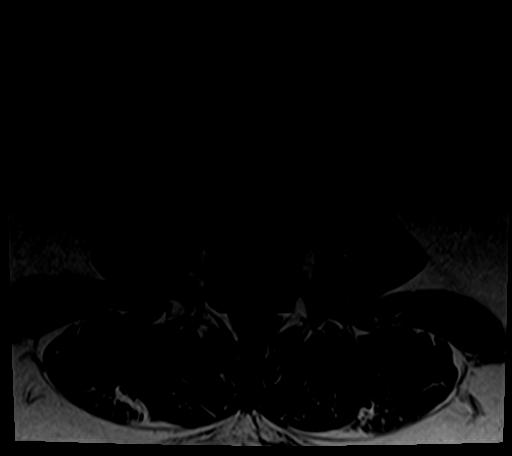
[im 32/37]
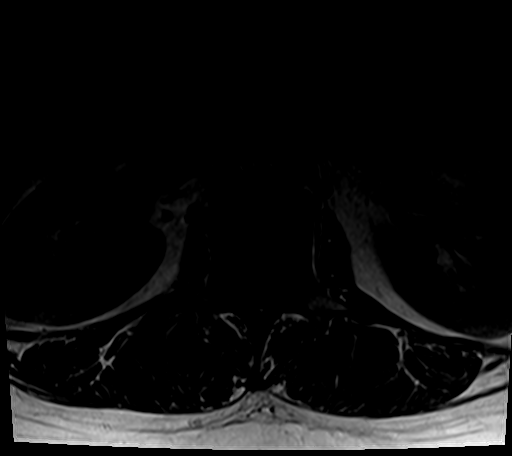

[27 of 48 positions shown; findings below may reference images not displayed]

FINDINGS: Segmentation:  Normal

Alignment:  Normal

Vertebrae: Negative for fracture or mass. Fatty changes in the bone
marrow related to disc degeneration L4-5 and L5-S1.

Conus medullaris: Extends to the L1-2 level and appears normal.

Paraspinal and other soft tissues: Paraspinous muscles are
symmetric. Right renal cyst. No retroperitoneal adenopathy

Disc levels:

T12-L1:  Shallow central disc protrusion without stenosis

L1-2:  Mild disc degeneration without stenosis

L2-3:  Negative

L3-4:  Negative

L4-5: Mild disc and facet degeneration. Mild disc bulging and mild
spinal stenosis

L5-S1: Small right paracentral disc protrusion with displacement of
the right S1 nerve root.
IMPRESSION: Mild spinal stenosis L4-5

Small right paracentral disc protrusion L5-S1 with possible
impingement of the right S1 nerve root.

## 2018-10-25 ENCOUNTER — Encounter: Payer: Self-pay | Admitting: Gynecology

## 2018-10-25 ENCOUNTER — Encounter: Payer: BC Managed Care – PPO | Admitting: Women's Health

## 2018-11-20 ENCOUNTER — Encounter: Payer: BC Managed Care – PPO | Admitting: Women's Health

## 2018-12-06 ENCOUNTER — Ambulatory Visit (INDEPENDENT_AMBULATORY_CARE_PROVIDER_SITE_OTHER): Payer: BC Managed Care – PPO | Admitting: Women's Health

## 2018-12-06 ENCOUNTER — Other Ambulatory Visit: Payer: Self-pay

## 2018-12-06 ENCOUNTER — Encounter: Payer: Self-pay | Admitting: Women's Health

## 2018-12-06 VITALS — BP 118/80 | Ht 66.0 in | Wt 211.0 lb

## 2018-12-06 DIAGNOSIS — Z23 Encounter for immunization: Secondary | ICD-10-CM | POA: Diagnosis not present

## 2018-12-06 DIAGNOSIS — F419 Anxiety disorder, unspecified: Secondary | ICD-10-CM | POA: Diagnosis not present

## 2018-12-06 DIAGNOSIS — Z01419 Encounter for gynecological examination (general) (routine) without abnormal findings: Secondary | ICD-10-CM

## 2018-12-06 MED ORDER — HYDROCORT-PRAMOXINE (PERIANAL) 2.5-1 % EX CREA: TOPICAL_CREAM | Freq: Three times a day (TID) | CUTANEOUS | 6 refills | Status: AC

## 2018-12-06 MED ORDER — ALPRAZOLAM 0.25 MG PO TABS: 0.2500 mg | ORAL_TABLET | Freq: Every evening | ORAL | 1 refills | Status: AC | PRN

## 2018-12-06 NOTE — Progress Notes (Signed)
Angela Cooper 12/09/1965 [id number]    History:    Presents for annual exam.  Monthly cycle, not sexually active in years.  2014 LGSIL with normal Paps after.  12/2017 benign endometrial polyp.  Normal mammogram history.  Has not had a screening colonoscopy.  2017 - BRCA testing.  Past medical history, past surgical history, family history and social history were all reviewed and documented in the EPIC chart.  Works at the [employer].  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  Mother ovarian cancer at age 2 deceased by 3, maternal grandmother ovarian cancer.  ROS:  A ROS was performed and pertinent positives and negatives are included.  Exam:  Vitals:   12/06/18 1625  BP: 118/80  Weight: 211 lb (95.7 kg)  Height: '5\' 6"'$  (1.676 m)   Body mass index is 34.06 kg/m.   General appearance:  Normal Thyroid:  Symmetrical, normal in size, without palpable masses or nodularity. Respiratory  Auscultation:  Clear without wheezing or rhonchi Cardiovascular  Auscultation:  Regular rate, without rubs, murmurs or gallops  Edema/varicosities:  Not grossly evident Abdominal  Soft,nontender, without masses, guarding or rebound.  Liver/spleen:  No organomegaly noted  Hernia:  None appreciated  Skin  Inspection:  Grossly normal   Breasts: Examined lying and sitting.     Right: Without masses, retractions, discharge or axillary adenopathy.     Left: Without masses, retractions, discharge or axillary adenopathy. Gentitourinary   Inguinal/mons:  Normal without inguinal adenopathy  External genitalia:  Normal  BUS/Urethra/Skene's glands:  Normal  Vagina:  Normal  Cervix:  Normal  Uterus:  normal in size, shape and contour.  Midline and mobile  Adnexa/parametria:     Rt: Without masses or tenderness.   Lt: Without masses or tenderness.  Anus and perineum: Small hemorrhoid   Digital rectal exam: Normal sphincter tone without palpated masses or tenderness  Assessment/Plan:  53 y.o. D WF  G3, P1 for annual exam with no complaints.  Monthly cycle/not sexually active 2014 LGSIL normal Paps after Negative BRCA testing/mother and maternal grandmother ovarian cancer history Hemorrhoids Obesity Smoker  Plan: Analpram hemorrhoidal cream prescription, proper use given and reviewed.  Xanax 0.25 prescription, addictive properties reviewed uses sparingly with sleep.  Sleep hygiene reviewed.  Contraception discussed declines need.  SBEs, continue annual screening mammogram has had at work.  Encouraged increased exercise and decrease calories and carbs.  Tips for quitting smoking discussed aware of hazards.  Colonoscopy discussed [facility] information given instructed to schedule.  CBC, CMP, Pap with HR HPV typing, new screening guidelines reviewed.  Normal lipid panel 2019.   Huel Cote [facility], 5:10 PM 12/06/2018

## 2018-12-06 NOTE — Patient Instructions (Addendum)
It was nice seeing you today Colonoscopy [facility]  [phone]  Dr Carlean Purl Vit D3 2000 iu daily  Health Maintenance, Female Adopting a healthy lifestyle and getting preventive care are important in promoting and wellness. Ask your health care provider about:  The right schedule for you to have regular tests and exams.  Things you can do on your own to prevent diseases and keep yourself healthy. What should I know about diet, weight, and exercise? Eat a healthy diet   Eat a diet that includes plenty of vegetables, fruits, low-fat dairy products, and lean protein.  Do not eat a lot of foods that are high in solid fats, added sugars, or sodium. Maintain a healthy weight Body mass index (BMI) is used to identify weight problems. It estimates body fat based on height and weight. Your health care provider can help determine your BMI and help you achieve or maintain a healthy weight. Get regular exercise Get regular exercise. This is one of the most important things you can do for your health. Most adults should:  Exercise for at least 150 minutes each week. The exercise should increase your heart rate and make you sweat (moderate-intensity exercise).  Do strengthening exercises at least twice a week. This is in addition to the moderate-intensity exercise.  Spend less time sitting. Even light physical activity can be beneficial. Watch cholesterol and blood lipids Have your blood tested for lipids and cholesterol at 52 years of age, then have this test every 5 years. Have your cholesterol levels checked more often if:  Your lipid or cholesterol levels are high.  You are older than 52 years of age.  You are at high risk for heart disease. What should I know about cancer screening? Depending on your health history and family history, you may need to have cancer screening at various ages. This may include screening for:  Breast cancer.  Cervical cancer.  Colorectal cancer.  Skin  cancer.  Lung cancer. What should I know about heart disease, diabetes, and high blood pressure? Blood pressure and heart disease  High blood pressure causes heart disease and increases the risk of stroke. This is more likely to develop in people who have high blood pressure readings, are of African descent, or are overweight.  Have your blood pressure checked: ? Every 3-5 years if you are 21-3 years of age. ? Every year if you are 36 years old or older. Diabetes Have regular diabetes screenings. This checks your fasting blood sugar level. Have the screening done:  Once every three years after age 49 if you are at a normal weight and have a low risk for diabetes.  More often and at a younger age if you are overweight or have a high risk for diabetes. What should I know about preventing infection? Hepatitis B If you have a higher risk for hepatitis B, you should be screened for this virus. Talk with your health care provider to find out if you are at risk for hepatitis B infection. Hepatitis C Testing is recommended for:  Everyone born from [birth year] through 1965.  Anyone with known risk factors for hepatitis C. Sexually transmitted infections (STIs)  Get screened for STIs, including gonorrhea and chlamydia, if: ? You are sexually active and are younger than 52 years of age. ? You are older than 52 years of age and your health care provider tells you that you are at risk for this type of infection. ? Your sexual activity has changed since you  were last screened, and you are at increased risk for chlamydia or gonorrhea. Ask your health care provider if you are at risk.  Ask your health care provider about whether you are at high risk for HIV. Your health care provider may recommend a prescription medicine to help prevent HIV infection. If you choose to take medicine to prevent HIV, you should first get tested for HIV. You should then be tested every 3 months for as long as you are taking  the medicine. Pregnancy  If you are about to stop having your period (premenopausal) and you may become pregnant, seek counseling before you get pregnant.  Take 400 to 800 micrograms (mcg) of folic acid every day if you become pregnant.  Ask for birth control (contraception) if you want to prevent pregnancy. Osteoporosis and menopause Osteoporosis is a disease in which the bones lose minerals and strength with aging. This can result in bone fractures. If you are 68 years old or older, or if you are at risk for osteoporosis and fractures, ask your health care provider if you should:  Be screened for bone loss.  Take a calcium or vitamin D supplement to lower your risk of fractures.  Be given hormone replacement therapy (HRT) to treat symptoms of menopause. Follow these instructions at home: Lifestyle  Do not use any products that contain nicotine or tobacco, such as cigarettes, e-cigarettes, and chewing tobacco. If you need help quitting, ask your health care provider.  Do not use street drugs.  Do not share needles.  Ask your health care provider for help if you need support or information about quitting drugs. Alcohol use  Do not drink alcohol if: ? Your health care provider tells you not to drink. ? You are pregnant, may be pregnant, or are planning to become pregnant.  If you drink alcohol: ? Limit how much you use to 0-1 drink a day. ? Limit intake if you are breastfeeding.  Be aware of how much alcohol is in your drink. In the [place] one drink equals one 12 oz bottle of beer (355 mL), one 5 oz glass of wine (148 mL), or one 1 oz glass of hard liquor (44 mL). General instructions  Schedule regular health, dental, and eye exams.  Stay current with your vaccines.  Tell your health care provider if: ? You often feel depressed. ? You have ever been abused or do not feel safe at home. Summary  Adopting a healthy lifestyle and getting preventive care are important in  promoting health and wellness.  Follow your health care provider's instructions about healthy diet, exercising, and getting tested or screened for diseases.  Follow your health care provider's instructions on monitoring your cholesterol and blood pressure. This information is not intended to replace advice given to you by your health care provider. Make sure you discuss any questions you have with your health care provider. Document Released: 08/03/2010 Document Revised: 01/11/2018 Document Reviewed: 01/11/2018 Elsevier Patient Education  2020 [employer].

## 2018-12-07 LAB — CBC WITH DIFFERENTIAL/PLATELET
Absolute Monocytes: 752 cells/uL (ref 200–950)
Basophils Absolute: 28 cells/uL (ref 0–200)
Basophils Relative: 0.4 %
Eosinophils Absolute: 173 cells/uL (ref 15–500)
Eosinophils Relative: 2.5 %
HCT: 36.9 % (ref 35.0–45.0)
Hemoglobin: 12.4 g/dL (ref 11.7–15.5)
Lymphs Abs: 2374 cells/uL (ref 850–3900)
MCH: 31.1 pg (ref 27.0–33.0)
MCHC: 33.6 g/dL (ref 32.0–36.0)
MCV: 92.5 fL (ref 80.0–100.0)
MPV: 11.6 fL (ref 7.5–12.5)
Monocytes Relative: 10.9 %
Neutro Abs: 3574 cells/uL (ref 1500–7800)
Neutrophils Relative %: 51.8 %
Platelets: 222 10*3/uL (ref 140–400)
RBC: 3.99 10*6/uL (ref 3.80–5.10)
RDW: 13.3 % (ref 11.0–15.0)
Total Lymphocyte: 34.4 %
WBC: 6.9 10*3/uL (ref 3.8–10.8)

## 2018-12-07 LAB — COMPREHENSIVE METABOLIC PANEL
AG Ratio: 1.9 (calc) (ref 1.0–2.5)
ALT: 8 U/L (ref 6–29)
AST: 13 U/L (ref 10–35)
Albumin: 4.3 g/dL (ref 3.6–5.1)
Alkaline phosphatase (APISO): 43 U/L (ref 37–153)
BUN: 16 mg/dL (ref 7–25)
CO2: 25 mmol/L (ref 20–32)
Calcium: 8.8 mg/dL (ref 8.6–10.4)
Chloride: 104 mmol/L (ref 98–110)
Creat: 0.78 mg/dL (ref 0.50–1.05)
Globulin: 2.3 g/dL (calc) (ref 1.9–3.7)
Glucose, Bld: 79 mg/dL (ref 65–99)
Potassium: 3.8 mmol/L (ref 3.5–5.3)
Sodium: 140 mmol/L (ref 135–146)
Total Bilirubin: 0.4 mg/dL (ref 0.2–1.2)
Total Protein: 6.6 g/dL (ref 6.1–8.1)

## 2018-12-11 DIAGNOSIS — Z01419 Encounter for gynecological examination (general) (routine) without abnormal findings: Secondary | ICD-10-CM | POA: Diagnosis not present

## 2018-12-11 NOTE — Addendum Note (Signed)
Addended by: Lorine Bears on: 12/11/2018 08:34 AM   Modules accepted: Orders

## 2018-12-12 LAB — PAP, TP IMAGING W/ HPV RNA, RFLX HPV TYPE 16,18/45: HPV DNA High Risk: NOT DETECTED

## 2018-12-13 ENCOUNTER — Telehealth: Payer: Self-pay | Admitting: *Deleted

## 2018-12-13 MED ORDER — METRONIDAZOLE 500 MG PO TABS: 500.0000 mg | ORAL_TABLET | Freq: Two times a day (BID) | ORAL | 0 refills | Status: AC

## 2018-12-13 NOTE — Telephone Encounter (Signed)
Refill

## 2018-12-15 ENCOUNTER — Telehealth: Payer: Self-pay | Admitting: *Deleted

## 2018-12-15 NOTE — Telephone Encounter (Signed)
PA done via cover my meds for analpram 2.5-1% rectal cream, medication denied by insurance not a covered benefit with current drug plan. Pharmacy informed.

## 2019-09-03 DIAGNOSIS — F329 Major depressive disorder, single episode, unspecified: Secondary | ICD-10-CM | POA: Diagnosis not present

## 2019-10-02 DIAGNOSIS — Z1231 Encounter for screening mammogram for malignant neoplasm of breast: Secondary | ICD-10-CM | POA: Diagnosis not present

## 2020-04-17 DIAGNOSIS — Z0000 Encounter for general adult medical examination without abnormal findings: Secondary | ICD-10-CM | POA: Diagnosis not present

## 2020-04-17 DIAGNOSIS — R5383 Other fatigue: Secondary | ICD-10-CM | POA: Diagnosis not present

## 2020-04-17 DIAGNOSIS — Z01419 Encounter for gynecological examination (general) (routine) without abnormal findings: Secondary | ICD-10-CM | POA: Diagnosis not present

## 2020-07-14 DIAGNOSIS — Z20822 Contact with and (suspected) exposure to covid-19: Secondary | ICD-10-CM | POA: Diagnosis not present

## 2020-07-14 DIAGNOSIS — Z7189 Other specified counseling: Secondary | ICD-10-CM | POA: Diagnosis not present

## 2020-10-07 DIAGNOSIS — Z1231 Encounter for screening mammogram for malignant neoplasm of breast: Secondary | ICD-10-CM | POA: Diagnosis not present

## 2020-10-09 DIAGNOSIS — G4700 Insomnia, unspecified: Secondary | ICD-10-CM | POA: Diagnosis not present

## 2020-10-09 DIAGNOSIS — R5383 Other fatigue: Secondary | ICD-10-CM | POA: Diagnosis not present

## 2021-11-10 ENCOUNTER — Encounter: Payer: Self-pay | Admitting: Gastroenterology

## 2021-11-27 ENCOUNTER — Ambulatory Visit (AMBULATORY_SURGERY_CENTER): Payer: Self-pay

## 2021-11-27 VITALS — Ht 66.0 in | Wt 199.0 lb

## 2021-11-27 DIAGNOSIS — Z1211 Encounter for screening for malignant neoplasm of colon: Secondary | ICD-10-CM

## 2021-11-27 MED ORDER — NA SULFATE-K SULFATE-MG SULF 17.5-3.13-1.6 GM/177ML PO SOLN: 1.0000 | Freq: Once | ORAL | 0 refills | Status: AC

## 2021-11-27 MED ORDER — SUTAB 1479-225-188 MG PO TABS: 1.0000 | ORAL_TABLET | ORAL | 0 refills | Status: DC

## 2021-11-27 NOTE — Progress Notes (Signed)

## 2021-12-09 ENCOUNTER — Encounter: Payer: Self-pay | Admitting: Gastroenterology

## 2021-12-17 ENCOUNTER — Encounter: Payer: Self-pay | Admitting: Gastroenterology

## 2021-12-17 ENCOUNTER — Ambulatory Visit (AMBULATORY_SURGERY_CENTER): Payer: BC Managed Care – PPO | Admitting: Gastroenterology

## 2021-12-17 ENCOUNTER — Telehealth: Payer: Self-pay

## 2021-12-17 VITALS — BP 125/80 | HR 71 | Temp 98.0°F | Resp 18 | Ht 66.0 in | Wt 199.0 lb

## 2021-12-17 DIAGNOSIS — Z1211 Encounter for screening for malignant neoplasm of colon: Secondary | ICD-10-CM

## 2021-12-17 MED ORDER — SODIUM CHLORIDE 0.9 % IV SOLN: 500.0000 mL | Freq: Once | INTRAVENOUS | Status: AC

## 2021-12-17 NOTE — Progress Notes (Signed)
Pt's states no medical or surgical changes since previsit or office visit. 

## 2021-12-17 NOTE — Telephone Encounter (Signed)
-----   Message from Jenel Lucks, MD sent at 12/17/2021  8:42 AM EST ----- Bonita Quin,  Can you get Ms. Neva Seat scheduled for a hemorrhoid banding appointment?  Thanks

## 2021-12-17 NOTE — Op Note (Signed)
[facility] Patient Name: Angela Cooper Procedure Date: 12/17/2021 8:02 AM MRN: [id number] Endoscopist: Lorin Picket E. Tomasa Rand , MD, [id number] Age: 56 Referring MD:  Date of Birth: 1965-11-03 Gender: Female Account #: [id number] Procedure:                Colonoscopy Indications:              Screening for colorectal malignant neoplasm, This                            is the patient's first colonoscopy Medicines:                Monitored Anesthesia Care Procedure:                Pre-Anesthesia Assessment:                           - Prior to the procedure, a History and Physical                            was performed, and patient medications and                            allergies were reviewed. The patient's tolerance of                            previous anesthesia was also reviewed. The risks                            and benefits of the procedure and the sedation                            options and risks were discussed with the patient.                            All questions were answered, and informed consent                            was obtained. Prior Anticoagulants: The patient has                            taken no anticoagulant or antiplatelet agents. ASA                            Grade Assessment: II - A patient with mild systemic                            disease. After reviewing the risks and benefits,                            the patient was deemed in satisfactory condition to                            undergo the procedure.  After obtaining informed consent, the colonoscope                            was passed under direct vision. Throughout the                            procedure, the patient's blood pressure, pulse, and                            oxygen saturations were monitored continuously. The                            CF [id number] #[id number] was introduced through the anus                            and advanced to  the the terminal ileum, with                            identification of the appendiceal orifice and IC                            valve. The colonoscopy was somewhat difficult due                            to significant looping and a tortuous colon.                            Successful completion of the procedure was aided by                            using manual pressure. The patient tolerated the                            procedure well. The quality of the bowel                            preparation was excellent. The terminal ileum,                            ileocecal valve, appendiceal orifice, and rectum                            were photographed. The bowel preparation used was                            SUPREP via split dose instruction. Scope In: 8:08:43 AM Scope Out: 8:23:39 AM Scope Withdrawal Time: 0 hours 9 minutes 8 seconds  Total Procedure Duration: 0 hours 14 minutes 56 seconds  Findings:                 Hemorrhoids were found on perianal exam.                           The digital rectal exam was normal. Pertinent  negatives include normal sphincter tone and no                            palpable rectal lesions.                           Many large-mouthed and small-mouthed diverticula                            were found in the sigmoid colon, descending colon                            and distal transverse colon. There was no evidence                            of diverticular bleeding.                           The exam was otherwise normal throughout the                            examined colon.                           The terminal ileum appeared normal.                           Non-bleeding internal hemorrhoids were found during                            retroflexion and during perianal exam. The                            hemorrhoids were large and Grade III (internal                            hemorrhoids that prolapse but  require manual                            reduction).                           No additional abnormalities were found on                            retroflexion. Complications:            No immediate complications. Estimated Blood Loss:     Estimated blood loss: none. Impression:               - Hemorrhoids found on perianal exam.                           - Moderate diverticulosis in the sigmoid colon, in                            the descending colon and in the distal transverse  colon. There was no evidence of diverticular                            bleeding.                           - The examined portion of the ileum was normal.                           - Non-bleeding internal hemorrhoids.                           - No specimens collected. Recommendation:           - Patient has a contact number available for                            emergencies. The signs and symptoms of potential                            delayed complications were discussed with the                            patient. Return to normal activities tomorrow.                            Written discharge instructions were provided to the                            patient.                           - Resume previous diet.                           - Continue present medications.                           - Repeat colonoscopy in 10 years for screening                            purposes.                           - Consider hemorrhoid banding to improve                            hemorrhoidal symptoms.                           - Recommend high fiber diet/daily fiber                            supplementation to reduce hemorrhoid symptoms and                            risk of diverticular complications. Quantae Martel E. Tomasa Randunningham, MD 12/17/2021 8:32:32 AM This report has  been signed electronically.

## 2021-12-17 NOTE — Telephone Encounter (Signed)
Correction, pt is scheduled for hem banding 01/20/22 at 11:10am.

## 2021-12-17 NOTE — Telephone Encounter (Signed)
Pt scheduled for hem banding 01/18/22@3 :40pm.

## 2021-12-17 NOTE — Progress Notes (Signed)
[facility] History and Physical   Primary Care Physician:  Patient, No Pcp Per   Reason for Procedure:   Colon cancer screening  Plan:    Screening colonoscopy     HPI: Angela Cooper is a 56 y.o. female undergoing initial average risk screening colonoscopy.  She has no family history of colon cancer and no chronic GI symptoms.    History reviewed. No pertinent past medical history.  Past Surgical History:  Procedure Laterality Date   TONSILLECTOMY     AGE 40   TYMPANOSTOMY TUBE PLACEMENT     AGE 64    Prior to Admission medications   Medication Sig Start Date End Date Taking? Authorizing Provider  ALPRAZolam (XANAX) 0.25 MG tablet Take 1 tablet (0.25 mg total) by mouth at bedtime as needed. 12/06/18  Yes Huel Cote, NP  hydrocortisone-pramoxine [facility]) 2.5-1 % rectal cream Place rectally 3 (three) times daily. Patient not taking: Reported on 11/27/2021 12/06/18   Huel Cote, NP    Current Outpatient Medications  Medication Sig Dispense Refill   ALPRAZolam (XANAX) 0.25 MG tablet Take 1 tablet (0.25 mg total) by mouth at bedtime as needed. 30 tablet 1   hydrocortisone-pramoxine (ANALPRAM-HC) 2.5-1 % rectal cream Place rectally 3 (three) times daily. (Patient not taking: Reported on 11/27/2021) 30 g 6   Current Facility-Administered Medications  Medication Dose Route Frequency Provider Last Rate Last Admin   0.9 %  sodium chloride infusion  500 mL Intravenous Once Daryel November, MD        Allergies as of 12/17/2021 - Review Complete 12/17/2021  Allergen Reaction Noted   Shellfish allergy Hives 11/27/2021    Family History  Problem Relation Age of Onset   Ovarian cancer Mother 76   Diabetes Father    Hypertension Father    Anemia Father        myelodysplastic anemia   Other Sister        has had genetic testing   Other Sister        has had genetic testing - VUS in the ATM gene   Other Sister        has had negative genetic  testing   Other Sister        no genetic testing yet   Lung cancer Paternal Aunt        dx. early to [place]; smoker   Leukemia Paternal Aunt 41   Ovarian cancer Maternal Grandmother 57   Heart attack Maternal Grandfather    Breast cancer Paternal Grandmother        dx. less than 34   Uterine cancer Paternal Grandmother 79   Heart attack Paternal Grandfather    Heart attack Other        MGF's sister   Heart attack Other        MGF's brother   Colon cancer Neg Hx    Colon polyps Neg Hx    Esophageal cancer Neg Hx    Rectal cancer Neg Hx    Stomach cancer Neg Hx     Social History   Socioeconomic History   Marital status: Divorced    Spouse name: Not on file   Number of children: Not on file   Years of education: Not on file   Highest education level: Not on file  Occupational History   Not on file  Tobacco Use   Smoking status: Every Day    Packs/day: 1.00    Years: 20.00  Total pack years: 20.00    Types: Cigarettes   Smokeless tobacco: Never  Vaping Use   Vaping Use: Never used  Substance and Sexual Activity   Alcohol use: Yes    Comment: SOCIALLY   Drug use: No   Sexual activity: Not Currently    Birth control/protection: None    Comment: Patient declined sexual hx questions  Other Topics Concern   Not on file  Social History Narrative   Not on file   Social Determinants of Health   Financial Resource Strain: Not on file  Food Insecurity: Not on file  Transportation Needs: Not on file  Physical Activity: Not on file  Stress: Not on file  Social Connections: Not on file  Intimate Partner Violence: Not on file    Review of Systems:  All other review of systems negative except as mentioned in the HPI.  Physical Exam: Vital signs BP 121/67   Pulse 67   Temp 98 F (36.7 C) (Temporal)   Ht _0  (1.676 m)   Wt 199 lb (90.3 kg)   SpO2 97%   BMI 32.12 kg/m   General:   Alert,  Well-developed, well-nourished, pleasant and cooperative in  NAD Airway:  Mallampati 2 Lungs:  Clear throughout to auscultation.   Heart:  Regular rate and rhythm; no murmurs, clicks, rubs,  or gallops. Abdomen:  Soft, nontender and nondistended. Normal bowel sounds.   Neuro/Psych:  Normal mood and affect. A and O x 3   Lunetta Marina E. Candis Schatz, MD [facility]

## 2021-12-17 NOTE — Progress Notes (Signed)
Pt awake, alert and oriented. VSS. Airway intact. SBAR complete to RN. All questions answered.  

## 2021-12-17 NOTE — Patient Instructions (Signed)
Information on hemorrhoids and diverticulosis given to you today.  Resume previous diet and medications.  Repeat colonoscopy in 10 years for screening purposes.   YOU HAD AN ENDOSCOPIC PROCEDURE TODAY AT THE [facility]:   Refer to the procedure report that was given to you for any specific questions about what was found during the examination.  If the procedure report does not answer your questions, please call your gastroenterologist to clarify.  If you requested that your care partner not be given the details of your procedure findings, then the procedure report has been included in a sealed envelope for you to review at your convenience later.  YOU SHOULD EXPECT: Some feelings of bloating in the abdomen. Passage of more gas than usual.  Walking can help get rid of the air that was put into your GI tract during the procedure and reduce the bloating. If you had a lower endoscopy (such as a colonoscopy or flexible sigmoidoscopy) you may notice spotting of blood in your stool or on the toilet paper. If you underwent a bowel prep for your procedure, you may not have a normal bowel movement for a few days.  Please Note:  You might notice some irritation and congestion in your nose or some drainage.  This is from the oxygen used during your procedure.  There is no need for concern and it should clear up in a day or so.  SYMPTOMS TO REPORT IMMEDIATELY:  Following lower endoscopy (colonoscopy or flexible sigmoidoscopy):  Excessive amounts of blood in the stool  Significant tenderness or worsening of abdominal pains  Swelling of the abdomen that is new, acute  Fever of 100F or higher  For urgent or emergent issues, a gastroenterologist can be reached at any hour by calling [phone]. Do not use MyChart messaging for urgent concerns.    DIET:  We do recommend a small meal at first, but then you may proceed to your regular diet.  Drink plenty of fluids but you should avoid  alcoholic beverages for 24 hours.  ACTIVITY:  You should plan to take it easy for the rest of today and you should NOT DRIVE or use heavy machinery until tomorrow (because of the sedation medicines used during the test).    FOLLOW UP: Our staff will call the number listed on your records the next business day following your procedure.  We will call around 7:15- 8:00 am to check on you and address any questions or concerns that you may have regarding the information given to you following your procedure. If we do not reach you, we will leave a message.     If any biopsies were taken you will be contacted by phone or by letter within the next 1-3 weeks.  Please call us at [phone] if you have not heard about the biopsies in 3 weeks.    SIGNATURES/CONFIDENTIALITY: You and/or your care partner have signed paperwork which will be entered into your electronic medical record.  These signatures attest to the fact that that the information above on your After Visit Summary has been reviewed and is understood.  Full responsibility of the confidentiality of this discharge information lies with you and/or your care-partner.

## 2021-12-18 ENCOUNTER — Telehealth: Payer: Self-pay

## 2021-12-18 NOTE — Telephone Encounter (Signed)
  Follow up Call-     12/17/2021    7:17 AM  Call back number  Post procedure Call Back phone  # [phone]  Permission to leave phone message Yes     Patient questions:  Do you have a fever, pain , or abdominal swelling? No. Pain Score  0 *  Have you tolerated food without any problems? Yes.    Have you been able to return to your normal activities? Yes.    Do you have any questions about your discharge instructions: Diet   No. Medications  No. Follow up visit  No.  Do you have questions or concerns about your Care? No.  Actions: * If pain score is 4 or above: No action needed, pain <4.

## 2022-01-04 ENCOUNTER — Ambulatory Visit: Payer: BC Managed Care – PPO | Admitting: Nurse Practitioner

## 2022-01-20 ENCOUNTER — Encounter: Payer: BC Managed Care – PPO | Admitting: Gastroenterology

## 2022-07-01 ENCOUNTER — Other Ambulatory Visit: Payer: Self-pay | Admitting: Family Medicine

## 2022-07-01 ENCOUNTER — Ambulatory Visit: Payer: Self-pay

## 2022-07-01 DIAGNOSIS — M545 Low back pain, unspecified: Secondary | ICD-10-CM

## 2022-07-01 DIAGNOSIS — M549 Dorsalgia, unspecified: Secondary | ICD-10-CM
